# Patient Record
Sex: Male | Born: 1975 | Race: White | Hispanic: No | Marital: Married | Smoking: Never smoker
Health system: Southern US, Community
[De-identification: ages and names within clinical notes are randomized; demographics above are authoritative.]

## PROBLEM LIST (undated history)

## (undated) DIAGNOSIS — D239 Other benign neoplasm of skin, unspecified: Secondary | ICD-10-CM

## (undated) DIAGNOSIS — I82409 Acute embolism and thrombosis of unspecified deep veins of unspecified lower extremity: Secondary | ICD-10-CM

## (undated) DIAGNOSIS — J45909 Unspecified asthma, uncomplicated: Secondary | ICD-10-CM

## (undated) HISTORY — DX: Unspecified asthma, uncomplicated: J45.909

## (undated) HISTORY — PX: OTHER SURGICAL HISTORY: SHX169

## (undated) HISTORY — DX: Acute embolism and thrombosis of unspecified deep veins of unspecified lower extremity: I82.409

## (undated) HISTORY — DX: Other benign neoplasm of skin, unspecified: D23.9

---

## 2014-10-06 ENCOUNTER — Other Ambulatory Visit: Payer: Self-pay | Admitting: Gastroenterology

## 2014-10-06 DIAGNOSIS — R1011 Right upper quadrant pain: Secondary | ICD-10-CM

## 2014-10-06 DIAGNOSIS — R1013 Epigastric pain: Secondary | ICD-10-CM

## 2014-10-12 ENCOUNTER — Ambulatory Visit
Admission: RE | Admit: 2014-10-12 | Discharge: 2014-10-12 | Disposition: A | Discharge status: Home / Self Care | Payer: BLUE CROSS/BLUE SHIELD | Source: Ambulatory Visit | Attending: Gastroenterology | Admitting: Gastroenterology

## 2014-10-12 DIAGNOSIS — R1011 Right upper quadrant pain: Secondary | ICD-10-CM

## 2014-10-12 DIAGNOSIS — R1013 Epigastric pain: Secondary | ICD-10-CM

## 2014-11-08 ENCOUNTER — Ambulatory Visit
Admission: RE | Admit: 2014-11-08 | Discharge: 2014-11-08 | Disposition: A | Discharge status: Home / Self Care | Payer: BLUE CROSS/BLUE SHIELD | Source: Ambulatory Visit | Attending: Family Medicine | Admitting: Family Medicine

## 2014-11-08 ENCOUNTER — Other Ambulatory Visit: Payer: Self-pay | Admitting: Family Medicine

## 2014-11-08 DIAGNOSIS — J3489 Other specified disorders of nose and nasal sinuses: Secondary | ICD-10-CM

## 2016-12-16 ENCOUNTER — Encounter: Payer: Self-pay | Admitting: Neurology

## 2017-02-14 ENCOUNTER — Ambulatory Visit (INDEPENDENT_AMBULATORY_CARE_PROVIDER_SITE_OTHER): Payer: 59 | Admitting: Neurology

## 2017-02-14 ENCOUNTER — Other Ambulatory Visit (INDEPENDENT_AMBULATORY_CARE_PROVIDER_SITE_OTHER): Payer: 59

## 2017-02-14 ENCOUNTER — Encounter: Payer: Self-pay | Admitting: Neurology

## 2017-02-14 VITALS — BP 110/70 | HR 64 | Ht 75.0 in | Wt 187.1 lb

## 2017-02-14 DIAGNOSIS — R253 Fasciculation: Secondary | ICD-10-CM

## 2017-02-14 LAB — TSH: TSH: 1.43 u[IU]/mL (ref 0.35–4.50)

## 2017-02-14 LAB — VITAMIN B12: VITAMIN B 12: 412 pg/mL (ref 211–911)

## 2017-02-14 NOTE — Progress Notes (Signed)
[facility] Clinic Note - Initial Visit   Date: 02/14/17  Ural Acree MRN: [id number] DOB: 29-Jan-1976   Dear Dr. Kateri Plummer:  Thank you for your kind referral of Ilir Shea for consultation of muscle twitching. Although his history is well known to you, please allow [place] to reiterate it for the purpose of our medical record. The patient was accompanied to the clinic by self.   History of Present Illness: Jack Shea is a 41 y.o. right-handed Caucasian male with asthma presenting for evaluation of muscle twitches.    He is very active and run 20-miles/day.  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  Starting around July 2018, he started feeling twitching of the calves, which gradually involved his thighs, hips, elbows and hands.  He has only visualized it over the left ADM muscle.  Muscle twitches are not painful.  It is worse when he is at rest, such as on a flight or in the morning. Stress seems to trigger this.  He does not notice it when active.  He has eliminated caffeine, magnesium, and salt supplements.  He does not take any daily medications or supplements. He denies any weakness, cramps, difficulty swallowing/talking, or shortness of breath.  Over the the past 6 weeks, the sensation of muscles fluttering is much less frequent and intense.  His PCP's note from August was reviewed.  Labs looking for electrolyte imbalance were normal.  In 2016, he had severe GI illness due to food poisoning while in [place].  About 6 months later, he had a tick bite and was treated with a course of antibiotics for possible sinusitis and fatigue. He did not have any specific tick borne illness.  Past Medical History:  Diagnosis Date  . Asthma     Past Surgical History:  Procedure Laterality Date  . Facial reconstruction due to orbital and maxilla fracture        Medications:  No outpatient encounter  prescriptions on file as of 02/14/2017.   No facility-administered encounter medications on file as of 02/14/2017.      Allergies: No Known Allergies  Family History: Family History  Problem Relation Age of Onset  . Parkinson's disease Mother   . Stroke Father     Social History: Social History  Substance Use Topics  . Smoking status: Never Smoker  . Smokeless tobacco: Never Used  . Alcohol use Yes     Comment: 6-7 drinks per week   Social History   Social History Naval architect of Operations for [employer]   Education:  Therapist, occupational   He lives with wife and two children in a 2 story home.      Review of Systems:  CONSTITUTIONAL: No fevers, chills, night sweats, or weight loss.   EYES: No visual changes or eye pain ENT: No hearing changes.  No history of nose bleeds.   RESPIRATORY: No cough, wheezing and shortness of breath.   CARDIOVASCULAR: Negative for chest pain, and palpitations.   GI: Negative for abdominal discomfort, blood in stools or black stools.  No recent change in bowel habits.   GU:  No history of incontinence.   MUSCLOSKELETAL: No history of joint pain or swelling.  No myalgias.   SKIN: Negative for lesions, rash, and itching.   HEMATOLOGY/ONCOLOGY: Negative for prolonged bleeding, bruising easily, and swollen nodes.  No history of cancer.   ENDOCRINE: Negative for cold or heat  intolerance, polydipsia or goiter.   PSYCH:  No depression or anxiety symptoms.   NEURO: As Above.   Vital Signs:  BP 110/70   Pulse 64   Ht 6\' 3"  (1.905 m)   Wt 187 lb 2 oz (84.9 kg)   SpO2 97%   BMI 23.39 kg/m    General Medical Exam:   General:  Well appearing, comfortable.   Eyes/ENT: see cranial nerve examination.   Neck: No masses appreciated.  Full range of motion without tenderness.  No carotid bruits. Respiratory:  Clear to auscultation, good air entry bilaterally.   Cardiac:  Regular rate and rhythm, no murmur.   Extremities:   No deformities, edema, or skin discoloration.  Skin:  No rashes or lesions.  Neurological Exam: MENTAL STATUS including orientation to time, place, person, recent and remote memory, attention span and concentration, language, and fund of knowledge is normal.  Speech is not dysarthric.  CRANIAL NERVES II:  No visual field defects.  Unremarkable fundi.   III-IV-VI: Pupils equal round and reactive to light.  Normal conjugate, extra-ocular eye movements in all directions of gaze.  No nystagmus.  No ptosis.   V:  Normal facial sensation.    VII:  Normal facial symmetry and movements.  No pathologic facial reflexes.  VIII:  Normal hearing and vestibular function.   IX-X:  Normal palatal movement.   XI:  Normal shoulder shrug and head rotation.   XII:  Normal tongue strength and range of motion, no deviation or fasciculation.  MOTOR:  No atrophy, fasciculations or abnormal movements.  No pronator drift.  Tone is normal.    Right Upper Extremity:    Left Upper Extremity:    Deltoid  5/5   Deltoid  5/5   Biceps  5/5   Biceps  5/5   Triceps  5/5   Triceps  5/5   Wrist extensors  5/5   Wrist extensors  5/5   Wrist flexors  5/5   Wrist flexors  5/5   Finger extensors  5/5   Finger extensors  5/5   Finger flexors  5/5   Finger flexors  5/5   Dorsal interossei  5/5   Dorsal interossei  5/5   Abductor pollicis  5/5   Abductor pollicis  5/5   Tone (Ashworth scale)  0  Tone (Ashworth scale)  0   Right Lower Extremity:    Left Lower Extremity:    Hip flexors  5/5   Hip flexors  5/5   Hip extensors  5/5   Hip extensors  5/5   Knee flexors  5/5   Knee flexors  5/5   Knee extensors  5/5   Knee extensors  5/5   Dorsiflexors  5/5   Dorsiflexors  5/5   Plantarflexors  5/5   Plantarflexors  5/5   Toe extensors  5/5   Toe extensors  5/5   Toe flexors  5/5   Toe flexors  5/5   Tone (Ashworth scale)  0  Tone (Ashworth scale)  0   MSRs:  Right                                                                  Left brachioradialis 2+  brachioradialis 2+  biceps  2+  biceps 2+  triceps 2+  triceps 2+  patellar 2+  patellar 2+  ankle jerk 2+  ankle jerk 2+  Hoffman no  Hoffman no  plantar response down  plantar response down   SENSORY:  Normal and symmetric perception of light touch, pinprick, vibration, and proprioception.  Romberg's sign absent.   COORDINATION/GAIT: Normal finger-to- nose-finger and heel-to-shin.  Intact rapid alternating movements bilaterally.  Gait narrow based and stable. Tandem and stressed gait intact.    IMPRESSION: Mr. Maury DusShirlow is a 41 year-old man referred for evaluation of subjective sensation of muscle twitches.  His exam is entirely normal and I do not see any fasciculations or abnormal movements.  Labs looking for electrolyte imbalance was normal.  To be complete, I will check TSH and vitamin B12.  I reassured the patient the I did not see anything worrisome and these may be stress-induced or due to lack of regular sleep schedule from his frequent international travel for work. Encouraged patient to stay well-hydrated. All questions were answered.  Thank you for allowing me to participate in patient's care.  If I can answer any additional questions, I would be pleased to do so.    Sincerely,    Eirik Schueler K. Allena KatzPatel, DO

## 2017-02-14 NOTE — Patient Instructions (Signed)
Stay well-hydrated with electrolyte water, especially when active  Stress seems to be a trigger of your symptoms, recommend deep breathing, meditation, yoga exercises which may help  Check thyroid and vitamin B12 levels today  Please sign up for MyChart and we can post your results directly on the portal

## 2017-02-17 ENCOUNTER — Telehealth: Payer: Self-pay | Admitting: *Deleted

## 2017-02-17 NOTE — Telephone Encounter (Signed)
Patient notified

## 2017-02-17 NOTE — Telephone Encounter (Signed)
-----   Message from [place] Chardonika K Patel, DO sent at 02/14/2017  4:55 PM EDT ----- Please notify patient lab are within normal limits.  Thank you.

## 2018-03-20 DIAGNOSIS — H6981 Other specified disorders of Eustachian tube, right ear: Secondary | ICD-10-CM | POA: Diagnosis not present

## 2018-05-12 DIAGNOSIS — H6983 Other specified disorders of Eustachian tube, bilateral: Secondary | ICD-10-CM | POA: Diagnosis not present

## 2018-05-12 DIAGNOSIS — J343 Hypertrophy of nasal turbinates: Secondary | ICD-10-CM | POA: Diagnosis not present

## 2018-05-12 DIAGNOSIS — R1312 Dysphagia, oropharyngeal phase: Secondary | ICD-10-CM | POA: Diagnosis not present

## 2018-05-12 DIAGNOSIS — H9311 Tinnitus, right ear: Secondary | ICD-10-CM | POA: Diagnosis not present

## 2018-05-12 DIAGNOSIS — J342 Deviated nasal septum: Secondary | ICD-10-CM | POA: Diagnosis not present

## 2018-05-19 ENCOUNTER — Other Ambulatory Visit: Payer: Self-pay | Admitting: Otolaryngology

## 2018-05-19 DIAGNOSIS — H93A9 Pulsatile tinnitus, unspecified ear: Secondary | ICD-10-CM

## 2018-05-22 ENCOUNTER — Ambulatory Visit
Admission: RE | Admit: 2018-05-22 | Discharge: 2018-05-22 | Disposition: A | Discharge status: Home / Self Care | Payer: 59 | Source: Ambulatory Visit | Attending: Otolaryngology | Admitting: Otolaryngology

## 2018-05-22 DIAGNOSIS — H93A1 Pulsatile tinnitus, right ear: Secondary | ICD-10-CM | POA: Diagnosis not present

## 2018-05-22 DIAGNOSIS — H93A9 Pulsatile tinnitus, unspecified ear: Secondary | ICD-10-CM

## 2018-05-22 MED ORDER — GADOBENATE DIMEGLUMINE 529 MG/ML IV SOLN
17.0000 mL | Freq: Once | INTRAVENOUS | Status: AC | PRN
  Administered 2018-05-22: 17 mL via INTRAVENOUS

## 2018-10-28 DIAGNOSIS — Z20828 Contact with and (suspected) exposure to other viral communicable diseases: Secondary | ICD-10-CM | POA: Diagnosis not present

## 2019-02-28 DIAGNOSIS — Z20828 Contact with and (suspected) exposure to other viral communicable diseases: Secondary | ICD-10-CM | POA: Diagnosis not present

## 2020-01-29 DIAGNOSIS — L209 Atopic dermatitis, unspecified: Secondary | ICD-10-CM | POA: Diagnosis not present

## 2020-02-24 DIAGNOSIS — H00015 Hordeolum externum left lower eyelid: Secondary | ICD-10-CM | POA: Diagnosis not present

## 2020-02-28 DIAGNOSIS — Z20822 Contact with and (suspected) exposure to covid-19: Secondary | ICD-10-CM | POA: Diagnosis not present

## 2020-08-04 DIAGNOSIS — A09 Infectious gastroenteritis and colitis, unspecified: Secondary | ICD-10-CM | POA: Diagnosis not present

## 2020-12-28 NOTE — Progress Notes (Addendum)
Subjective:    CC: R knee pain  I, Jack Shea, LAT, ATC, am serving as scribe for Dr. Clementeen Graham.  HPI: Pt is a 45 y/o male presenting w/ R knee pain x 8 weeks.  Pt is a runner. Pt noticed R knee stiffness after shingling on a roof. He locates his pain to the medial aspect of his R knee and posteriorly when extending knee.  He has been unable to run since hurting his knee.  He has been doing exercises over the last 4 weeks to improve his knee pain including quadricep strengthening.  This has not helped.  Exercises are approximately daily for 20 minutes  Knee swelling: no Knee mechanical symptoms: yes clicking a popping Aggravating factors: stairs, sitting still for extended periods Treatments tried: ice, brace, NSAIDs  Pertinent review of Systems: no fever or chills  Relevant historical information: Otherwise healthy.  Running is his primary form of exercise.   Objective:    Vitals:   12/29/20 0801  BP: 130/86  Pulse: 75  SpO2: 98%   General: Well Developed, well nourished, and in no acute distress.   MSK: Right knee normal-appearing Normal motion without significant crepitation. Tender palpation medial joint line. Stable ligamentous exam. Positive medial McMurray's test. Intact strength.  Lab and Radiology Results  Procedure: Real-time Ultrasound Guided Injection of right knee superior lateral patellar space Device: Philips Affiniti 50G Images permanently stored and available for review in PACS Ultrasound evaluation prior to injection reveals intact quad tendon with mild joint effusion. Patellar tendon normal. Lateral joint line normal. Medial joint line slightly extruded without clear definitive meniscus tear. Posterior knee tiny Baker's cyst. Verbal informed consent obtained.  Discussed risks and benefits of procedure. Warned about infection bleeding damage to structures skin hypopigmentation and fat atrophy among others. Patient expresses understanding  and agreement Time-out conducted.   Noted no overlying erythema, induration, or other signs of local infection.   Skin prepped in a sterile fashion.   Local anesthesia: Topical Ethyl chloride.   With sterile technique and under real time ultrasound guidance: 40 mg of Kenalog and 2 mL of Marcaine injected into knee joint. Fluid seen entering the joint capsule.   Completed without difficulty   Pain immediately resolved suggesting accurate placement of the medication.   Advised to call if fevers/chills, erythema, induration, drainage, or persistent bleeding.   Images permanently stored and available for review in the ultrasound unit.  Impression: Technically successful ultrasound guided injection.    X-ray images right knee obtained today personally and independently interpreted. Minimal medial compartment DJD Await formal radiology review    Impression and Recommendations:    Assessment and Plan: 45 y.o. male with right knee pain that medial knee compartment with positive medial McMurray's test concerning for medial meniscus tear.  Patient has been unable to return to normal running and regular exercise over the last 4 weeks despite doing conservative management strategies including home exercise program..  Plan to continue and modify home exercise program as taught today in clinic by ATC.  Additionally recommend Voltaren gel and we will proceed with steroid injection.  If not improving over the next few weeks he will let me know and I will proceed to MRI.   AddendumGerlene Shea contacted me on September 15 and again on September 16 noting that his knee has worsened and now he has developed more significant mechanical symptoms including locking and catching.  He has failed conservative management and would like to proceed to MRI.  MRI ordered.  PDMP not reviewed this encounter. Orders Placed This Encounter  Procedures   [place] LIMITED JOINT SPACE STRUCTURES LOW RIGHT(NO LINKED CHARGES)     Standing Status:   Future    Number of Occurrences:   1    Standing Expiration Date:   06/28/2021    Order Specific Question:   Reason for Exam (SYMPTOM  OR DIAGNOSIS REQUIRED)    Answer:   right knee pain    Order Specific Question:   Preferred imaging location?    Answer:   [facility] Knee AP/LAT W/Sunrise Right    Standing Status:   Future    Number of Occurrences:   1    Standing Expiration Date:   12/29/2021    Order Specific Question:   Reason for Exam (SYMPTOM  OR DIAGNOSIS REQUIRED)    Answer:   right knee pain    Order Specific Question:   Preferred imaging location?    Answer:   [facility]   No orders of the defined types were placed in this encounter.   Discussed warning signs or symptoms. Please see discharge instructions. Patient expresses understanding.   The above documentation has been reviewed and is accurate and complete Clementeen Graham, M.D.

## 2020-12-29 ENCOUNTER — Ambulatory Visit: Payer: Self-pay

## 2020-12-29 ENCOUNTER — Other Ambulatory Visit: Payer: Self-pay

## 2020-12-29 ENCOUNTER — Ambulatory Visit (INDEPENDENT_AMBULATORY_CARE_PROVIDER_SITE_OTHER): Payer: BC Managed Care – PPO

## 2020-12-29 ENCOUNTER — Ambulatory Visit (INDEPENDENT_AMBULATORY_CARE_PROVIDER_SITE_OTHER): Payer: BC Managed Care – PPO | Admitting: Family Medicine

## 2020-12-29 VITALS — BP 130/86 | HR 75 | Ht 75.0 in | Wt 199.8 lb

## 2020-12-29 DIAGNOSIS — M25561 Pain in right knee: Secondary | ICD-10-CM

## 2020-12-29 DIAGNOSIS — M1711 Unilateral primary osteoarthritis, right knee: Secondary | ICD-10-CM | POA: Diagnosis not present

## 2020-12-29 NOTE — Patient Instructions (Addendum)
Thank you for coming in today.   Call or go to the ER if you develop a large red swollen joint with extreme pain or oozing puss.    Please complete the exercises that the athletic trainer went over with you:  View at [email or web address] using code: Andrey Campanile  Let me know if you are not improving. I will order an MRI.

## 2021-01-02 NOTE — Progress Notes (Signed)
Right knee x-ray shows mild arthritis.

## 2021-01-11 ENCOUNTER — Encounter: Payer: Self-pay | Admitting: Family Medicine

## 2021-01-11 DIAGNOSIS — M25561 Pain in right knee: Secondary | ICD-10-CM

## 2021-01-21 ENCOUNTER — Other Ambulatory Visit: Payer: Self-pay

## 2021-01-21 ENCOUNTER — Ambulatory Visit (INDEPENDENT_AMBULATORY_CARE_PROVIDER_SITE_OTHER): Payer: BC Managed Care – PPO

## 2021-01-21 DIAGNOSIS — G8929 Other chronic pain: Secondary | ICD-10-CM | POA: Diagnosis not present

## 2021-01-21 DIAGNOSIS — M25561 Pain in right knee: Secondary | ICD-10-CM

## 2021-01-23 ENCOUNTER — Telehealth: Payer: Self-pay | Admitting: Family Medicine

## 2021-01-23 DIAGNOSIS — M25561 Pain in right knee: Secondary | ICD-10-CM

## 2021-01-23 NOTE — Telephone Encounter (Signed)
Referral to orthopedic surgery ordered 

## 2021-01-23 NOTE — Progress Notes (Signed)
Right knee shows a medial meniscus tear flipped into the knee joint.  Additionally there is a area of high-grade cartilage loss into the knee joint that is also causing a problem.  This is probably a surgical problem.  I will refer directly to orthopedic surgery as I think that is ultimately where you are heading.  However if you would like to have a longer conversation about the results I am happy to have you return to clinic to go over in more detail.

## 2021-01-25 ENCOUNTER — Encounter: Payer: Self-pay | Admitting: Orthopaedic Surgery

## 2021-01-25 ENCOUNTER — Ambulatory Visit (INDEPENDENT_AMBULATORY_CARE_PROVIDER_SITE_OTHER): Payer: BC Managed Care – PPO | Admitting: Orthopaedic Surgery

## 2021-01-25 ENCOUNTER — Other Ambulatory Visit: Payer: Self-pay

## 2021-01-25 DIAGNOSIS — S83241A Other tear of medial meniscus, current injury, right knee, initial encounter: Secondary | ICD-10-CM

## 2021-01-25 HISTORY — DX: Other tear of medial meniscus, current injury, right knee, initial encounter: S83.241A

## 2021-01-25 NOTE — Progress Notes (Signed)
Office Visit Note   Patient: Jack Shea           Date of Birth: 1975/06/20           MRN: [id number] Visit Date: 01/25/2021              Requested by: Rodolph Bong, MD [address] [place],  [place] [address] PCP: Patient, No Pcp Per (Inactive)   Assessment & Plan: Visit Diagnoses:  1. Acute medial meniscus tear of right knee, initial encounter     Plan: MRI of the right knee shows a complex tear of the medial meniscus that has flipped into the medial gutter with some small surrounding subchondral edema that appears reactive.  He has chondromalacia of the medial compartment as well.  After reviewing these findings with Jack Shea I have recommended arthroscopic partial medial meniscectomy and chondroplasty.  He has had a prior cortisone injection without any significant relief and he continues to have constant locking symptoms with daily activities.  Risk benefits rehab recovery time out of work reviewed with the patient in detail.  Questions encouraged and answered.  We look forward to treating him in the operating theater in the near future.  Follow-Up Instructions: No follow-ups on file.   Orders:  No orders of the defined types were placed in this encounter.  No orders of the defined types were placed in this encounter.     Procedures: No procedures performed   Clinical Data: No additional findings.   Subjective: Chief Complaint  Patient presents with   Right Knee - Pain    Jack Shea is a very pleasant 45 year old gentleman referral from Dr. Denyse Amass for recent discovery of a medial meniscus tear found on MRI.  He has had pain from 12 weeks and it began after he knelt down to repair some roof shingles and since then he has had pain.  He is also an avid runner who runs about 15 to 20 miles a week.  He is experiencing constant locking symptoms on the medial side.  Occasional swelling.  Start up pain is fairly consistent.  He had a prior cortisone injection in early  September which gave him about 2 days of relief but then once he tried to run the pain immediately came back.  Subsequent MRI showed a complex tear of the medial meniscus.   Review of Systems  Constitutional: Negative.   All other systems reviewed and are negative.   Objective: Vital Signs: There were no vitals taken for this visit.  Physical Exam Vitals and nursing note reviewed.  Constitutional:      Appearance: He is well-developed.  Pulmonary:     Effort: Pulmonary effort is normal.  Abdominal:     Palpations: Abdomen is soft.  Skin:    General: Skin is warm.  Neurological:     Mental Status: He is alert and oriented to person, place, and time.  Psychiatric:        Behavior: Behavior normal.        Thought Content: Thought content normal.        Judgment: Judgment normal.    Ortho Exam  Right knee shows exquisite medial joint line tenderness with positive McMurray's sign.  Increased pain with flexion of the knee past 120 degrees.  Collaterals and cruciates are stable.  No joint effusion.  Specialty Comments:  No specialty comments available.  Imaging: No results found.   PMFS History: There are no problems to display for this patient.  Past  Medical History:  Diagnosis Date   Asthma     Family History  Problem Relation Age of Onset   Parkinson's disease Mother    Stroke Father     Past Surgical History:  Procedure Laterality Date   Facial reconstruction due to orbital and maxilla fracture      Social History   Occupational History   Occupation: Interior and spatial designer of operations  Tobacco Use   Smoking status: Never   Smokeless tobacco: Never  Vaping Use   Vaping Use: Never used  Substance and Sexual Activity   Alcohol use: Yes    Comment: 6-7 drinks per week   Drug use: No   Sexual activity: Not on file

## 2021-02-05 ENCOUNTER — Other Ambulatory Visit: Payer: Self-pay | Admitting: Physician Assistant

## 2021-02-05 MED ORDER — ONDANSETRON HCL 4 MG PO TABS: 4.0000 mg | ORAL_TABLET | Freq: Three times a day (TID) | ORAL | 0 refills | Status: DC | PRN

## 2021-02-05 MED ORDER — HYDROCODONE-ACETAMINOPHEN 5-325 MG PO TABS: 1.0000 | ORAL_TABLET | Freq: Four times a day (QID) | ORAL | 0 refills | Status: DC | PRN

## 2021-02-08 ENCOUNTER — Encounter: Payer: Self-pay | Admitting: Orthopaedic Surgery

## 2021-02-08 DIAGNOSIS — S83231A Complex tear of medial meniscus, current injury, right knee, initial encounter: Secondary | ICD-10-CM | POA: Diagnosis not present

## 2021-02-08 DIAGNOSIS — S83241A Other tear of medial meniscus, current injury, right knee, initial encounter: Secondary | ICD-10-CM | POA: Diagnosis not present

## 2021-02-08 DIAGNOSIS — G8918 Other acute postprocedural pain: Secondary | ICD-10-CM | POA: Diagnosis not present

## 2021-02-08 DIAGNOSIS — X58XXXA Exposure to other specified factors, initial encounter: Secondary | ICD-10-CM | POA: Diagnosis not present

## 2021-02-08 DIAGNOSIS — Y999 Unspecified external cause status: Secondary | ICD-10-CM | POA: Diagnosis not present

## 2021-02-08 DIAGNOSIS — M659 Synovitis and tenosynovitis, unspecified: Secondary | ICD-10-CM | POA: Diagnosis not present

## 2021-02-15 ENCOUNTER — Ambulatory Visit (HOSPITAL_COMMUNITY)
Admission: RE | Admit: 2021-02-15 | Discharge: 2021-02-15 | Disposition: A | Discharge status: Home / Self Care | Payer: BC Managed Care – PPO | Source: Ambulatory Visit | Attending: Orthopaedic Surgery | Admitting: Orthopaedic Surgery

## 2021-02-15 ENCOUNTER — Other Ambulatory Visit: Payer: Self-pay

## 2021-02-15 ENCOUNTER — Ambulatory Visit (INDEPENDENT_AMBULATORY_CARE_PROVIDER_SITE_OTHER): Payer: BC Managed Care – PPO | Admitting: Physician Assistant

## 2021-02-15 ENCOUNTER — Encounter: Payer: Self-pay | Admitting: Orthopaedic Surgery

## 2021-02-15 ENCOUNTER — Telehealth: Payer: Self-pay

## 2021-02-15 ENCOUNTER — Telehealth: Payer: Self-pay | Admitting: Physician Assistant

## 2021-02-15 ENCOUNTER — Other Ambulatory Visit: Payer: Self-pay | Admitting: Physician Assistant

## 2021-02-15 VITALS — Ht 75.0 in | Wt 199.0 lb

## 2021-02-15 DIAGNOSIS — M79661 Pain in right lower leg: Secondary | ICD-10-CM

## 2021-02-15 DIAGNOSIS — Z9889 Other specified postprocedural states: Secondary | ICD-10-CM

## 2021-02-15 MED ORDER — RIVAROXABAN (XARELTO) VTE STARTER PACK (15 & 20 MG): ORAL_TABLET | ORAL | 0 refills | Status: DC

## 2021-02-15 NOTE — Telephone Encounter (Signed)
done

## 2021-02-15 NOTE — Progress Notes (Signed)
   Post-Op Visit Note   Patient: Jack Shea           Date of Birth: January 09, 1976           MRN: [id number] Visit Date: 02/15/2021 PCP: Patient, No Pcp Per (Inactive)   Assessment & Plan:  Chief Complaint:  Chief Complaint  Patient presents with   Right Knee - Follow-up    Right knee scope 02/08/2021   Visit Diagnoses:  1. Pain in right lower leg   2. S/P right knee arthroscopy     Plan: Patient is a pleasant 45 year old gentleman who comes in today 1 week out right knee arthroscopic debridement medial meniscus, date of surgery 02/08/2021.  He has been doing well.  He is ambulating with a crutch as needed.  He is not having any pain to the knee, but does note pain and tightness to the right calf.  He has also noticed some swelling to the right knee.  No personal or family history of DVT/PE.  He is a non-smoker.  He is not on any blood thinners.  Examination of the right lower extremity reveals a small effusion to the knee.  Well-healed surgical portals with nylon sutures in place.  No evidence of infection or cellulitis.  No swelling to the lower extremity.  His calf is moderately tender.  No skin changes, warmth or erythema.  He does have a positive Homans.  He is neurovascular intact distally.  Today, sutures were removed and Steri-Strips applied.  Intraoperative pictures reviewed.  Home exercise program provided.  We will go ahead and order a venous Doppler ultrasound right lower extremity to rule out DVT.  He will follow-up with [place] in 5 weeks time for recheck.  Call with concerns or questions in the meantime.  Follow-Up Instructions: Return in about 5 weeks (around 03/22/2021).   Orders:  Orders Placed This Encounter  Procedures   VAS [place] LOWER EXTREMITY VENOUS (DVT)    No orders of the defined types were placed in this encounter.   Imaging: No new imaging  PMFS History: Patient Active Problem List   Diagnosis Date Noted   Acute medial meniscus tear of right knee  01/25/2021   Past Medical History:  Diagnosis Date   Asthma     Family History  Problem Relation Age of Onset   Parkinson's disease Mother    Stroke Father     Past Surgical History:  Procedure Laterality Date   Facial reconstruction due to orbital and maxilla fracture      Social History   Occupational History   Occupation: Interior and spatial designer of operations  Tobacco Use   Smoking status: Never   Smokeless tobacco: Never  Vaping Use   Vaping Use: Never used  Substance and Sexual Activity   Alcohol use: Yes    Comment: 6-7 drinks per week   Drug use: No   Sexual activity: Not on file

## 2021-02-15 NOTE — Telephone Encounter (Signed)
Patient called. Says the pharmacy will not have the medication until Monday. He would like the Xarelto sent to [facility] [address] His cb# [phone]

## 2021-02-15 NOTE — Progress Notes (Signed)
Lower extremity venous RT study completed.  Preliminary results relayed to office of Roda Shutters, MD. Lillia Abed, [place] to follow up.   See CV Proc for preliminary results report.   Jean Rosenthal, RDMS, RVT

## 2021-02-15 NOTE — Telephone Encounter (Signed)
He said nvm use [facility] [address]   Call when done please

## 2021-02-15 NOTE — Telephone Encounter (Signed)
+   DVT .  Jack Shea will in Rx. Patient aware he should contact PCP.

## 2021-02-16 ENCOUNTER — Other Ambulatory Visit: Payer: Self-pay

## 2021-02-16 DIAGNOSIS — Z7689 Persons encountering health services in other specified circumstances: Secondary | ICD-10-CM

## 2021-02-16 DIAGNOSIS — M79661 Pain in right lower leg: Secondary | ICD-10-CM

## 2021-02-16 NOTE — Telephone Encounter (Signed)
Thank you :)

## 2021-02-22 ENCOUNTER — Encounter: Payer: BC Managed Care – PPO | Admitting: Orthopaedic Surgery

## 2021-02-26 ENCOUNTER — Encounter (HOSPITAL_BASED_OUTPATIENT_CLINIC_OR_DEPARTMENT_OTHER): Payer: Self-pay | Admitting: Family Medicine

## 2021-02-26 ENCOUNTER — Ambulatory Visit (INDEPENDENT_AMBULATORY_CARE_PROVIDER_SITE_OTHER): Payer: BC Managed Care – PPO | Admitting: Family Medicine

## 2021-02-26 ENCOUNTER — Other Ambulatory Visit: Payer: Self-pay

## 2021-02-26 DIAGNOSIS — I82401 Acute embolism and thrombosis of unspecified deep veins of right lower extremity: Secondary | ICD-10-CM

## 2021-02-26 DIAGNOSIS — I82409 Acute embolism and thrombosis of unspecified deep veins of unspecified lower extremity: Secondary | ICD-10-CM | POA: Insufficient documentation

## 2021-02-26 MED ORDER — RIVAROXABAN 20 MG PO TABS: 20.0000 mg | ORAL_TABLET | Freq: Every day | ORAL | 1 refills | Status: DC

## 2021-02-26 NOTE — Assessment & Plan Note (Signed)
Acute DVT which would be considered provoked given that this occurred in the postoperative setting after right knee meniscal surgery.  As such discussed recommendations for 3 months of anticoagulation. Recommend continuing with current starter pack as prescribed by orthopedic surgeon, will send in for refills for daily Xarelto to continue until the end of the 69-month period. Would be okay to resume traveling for shorter flights to the [facility] as indicated above. Plan follow-up in about 2 months to monitor progress with Xarelto

## 2021-02-26 NOTE — Progress Notes (Signed)
New Patient Office Visit  Subjective:  Patient ID: Jack Shea, male    DOB: 1975/05/05  Age: 45 y.o. MRN: [id number]  CC:  Chief Complaint  Patient presents with   Establish Care    No Prior PCP    Blood Clot    Patient had a surgery to have his meniscus repaired on his right knee on 10/14 and later developed a blood clot in his right calf. He was started on xarelto from surgeons office and told to follow up with PCP     HPI Perle Gibbon is a 45 year old male presenting to establish in clinic.  Current concern related to developing blood clot after meniscal surgery.  Denies any significant past medical history.  Blood clot: Postoperatively after right meniscal surgery, patient was found to have acute DVT involving right gastrocnemius veins.  Patient was started on Xarelto by orthopedic surgeon and advised to follow-up with his PCP.  He began Xarelto about 11 days ago.  Reports that he has been tolerating this well.  Still some soreness in right calf, has improved recently.  Patient is originally from [place], [place].  Current work does involve a lot of travel as he oversees a business unit.  Indicates that travel is not as much as it used to be, he used to travel more so to [place] as well as [place].  Current travel is primarily to the [place].  Any flights that he does take are oftentimes less than 1 hour.  Patient has lived in [place] for about 15 years now.  Outside of work, he enjoys running, soccer, Government social research officer.  Past Medical History:  Diagnosis Date   Asthma     Past Surgical History:  Procedure Laterality Date   Facial reconstruction due to orbital and maxilla fracture       Family History  Problem Relation Age of Onset   Parkinson's disease Mother    Stroke Father    Early death Father     Social History   Socioeconomic History   Marital status: Married    Spouse name: Not on file   Number of children: 2   Years of education: 16    Highest education level: Not on file  Occupational History   Occupation: Interior and spatial designer of operations  Tobacco Use   Smoking status: Never   Smokeless tobacco: Never  Vaping Use   Vaping Use: Never used  Substance and Sexual Activity   Alcohol use: Yes    Alcohol/week: 8.0 standard drinks    Types: 3 Glasses of wine, 5 Cans of beer per week    Comment: 6-7 drinks per week   Drug use: No   Sexual activity: Not on file  Other Topics Concern   Not on file  Social History Narrative   Director of Operations for [employer]   Education:  Therapist, occupational   He lives with wife and two children in a 2 story home.     Social Determinants of Health   Financial Resource Strain: Not on file  Food Insecurity: Not on file  Transportation Needs: Not on file  Physical Activity: Not on file  Stress: Not on file  Social Connections: Not on file  Intimate Partner Violence: Not on file    Objective:   Today's Vitals: BP 110/84   Ht 6\' 3"  (1.905 m)   Wt 194 lb 6.4 oz (88.2 kg)   BMI 24.30 kg/m   Physical Exam  45 year old male in no acute  distress Cardiovascular exam with regular rate and rhythm, no murmurs appreciated Lungs clear to auscultation bilaterally  Assessment & Plan:   Problem List Items Addressed This Visit       Cardiovascular and Mediastinum   DVT (deep venous thrombosis) (HCC)    Acute DVT which would be considered provoked given that this occurred in the postoperative setting after right knee meniscal surgery.  As such discussed recommendations for 3 months of anticoagulation. Recommend continuing with current starter pack as prescribed by orthopedic surgeon, will send in for refills for daily Xarelto to continue until the end of the 78-month period. Would be okay to resume traveling for shorter flights to the [facility] as indicated above. Plan follow-up in about 2 months to monitor progress with Xarelto      Relevant Medications   rivaroxaban  (XARELTO) 20 MG TABS tablet    Outpatient Encounter Medications as of 02/26/2021  Medication Sig   rivaroxaban (XARELTO) 20 MG TABS tablet Take 1 tablet (20 mg total) by mouth daily with supper.   [DISCONTINUED] RIVAROXABAN (XARELTO) VTE STARTER PACK (15 & 20 MG) Follow package directions: Take one 15mg  tablet by mouth twice a day. On day 22, switch to one 20mg  tablet once a day. Take with food.   [DISCONTINUED] HYDROcodone-acetaminophen (NORCO) 5-325 MG tablet Take 1 tablet by mouth every 6 (six) hours as needed. To be taken after surgery   [DISCONTINUED] ondansetron (ZOFRAN) 4 MG tablet Take 1 tablet (4 mg total) by mouth every 8 (eight) hours as needed for nausea or vomiting. (Patient not taking: Reported on 02/26/2021)   No facility-administered encounter medications on file as of 02/26/2021.    Follow-up: Return in about 2 months (around 04/28/2021) for Follow Up.  Plan for follow-up in about 2 months to monitor anticoagulation therapy.  Irina Okelly J De 02/28/2021, MD

## 2021-02-26 NOTE — Patient Instructions (Signed)
°  Medication Instructions:  °Your physician recommends that you continue on your current medications as directed. Please refer to the Current Medication list given to you today. °--If you need a refill on any your medications before your next appointment, please call your pharmacy first. If no refills are authorized on file call the office.-- °Follow-Up: °Your next appointment:   °Your physician recommends that you schedule a follow-up appointment in: 2 MONTHS with Dr. de [place] ° °You will receive a text message or e-mail with a link to a survey about your care and experience with us today! We would greatly appreciate your feedback!  ° °Thanks for letting us be apart of your health journey!!  °Primary Care and Sports Medicine  ° °Dr. Raymond de [place]  ° °We encourage you to activate your patient portal called "MyChart".  Sign up information is provided on this After Visit Summary.  MyChart is used to connect with patients for Virtual Visits (Telemedicine).  Patients are able to view lab/test results, encounter notes, upcoming appointments, etc.  Non-urgent messages can be sent to your provider as well. To learn more about what you can do with MyChart, please visit --  [email or web address]   ° °

## 2021-03-16 ENCOUNTER — Other Ambulatory Visit: Payer: Self-pay

## 2021-03-16 ENCOUNTER — Ambulatory Visit (INDEPENDENT_AMBULATORY_CARE_PROVIDER_SITE_OTHER): Payer: BC Managed Care – PPO | Admitting: Orthopaedic Surgery

## 2021-03-16 ENCOUNTER — Encounter: Payer: Self-pay | Admitting: Orthopaedic Surgery

## 2021-03-16 DIAGNOSIS — S83241A Other tear of medial meniscus, current injury, right knee, initial encounter: Secondary | ICD-10-CM

## 2021-03-16 NOTE — Progress Notes (Signed)
   Post-Op Visit Note   Patient: Jack Shea           Date of Birth: January 13, 1976           MRN: [id number] Visit Date: 03/16/2021 PCP: de [place], Raymond J, MD   Assessment & Plan:  Chief Complaint:  Chief Complaint  Patient presents with   Right Knee - Follow-up    Right knee arthroscopy 02/08/2021   Visit Diagnoses:  1. Acute medial meniscus tear of right knee, initial encounter     Plan: Shammond is about 5 weeks status post right knee scope partial medial meniscectomy.  He is doing well overall.  He will be taking Xarelto for 3 months for the DVT.  In terms of his activity he has been doing squats at the gym and 2 miles on the elliptical and running in the pool.  He feels some clicking behind the patella and some difficulty with coming downstairs due to lack of range of motion.  Right knee shows fully healed surgical scars.  He does have a moderate joint effusion with moderate limitation in flexion.  No joint line tenderness.  Based on findings I think his knee is inflamed from overactivity.  I have recommended that he back off on this so that the knee can recover from the surgery.  I feel that the effusion is the source of his symptoms.  Will hold off on aspiration and cortisone injection for now unless it does not resolve.  He will come back to see me as needed.  Follow-Up Instructions: No follow-ups on file.   Orders:  No orders of the defined types were placed in this encounter.  No orders of the defined types were placed in this encounter.   Imaging: No results found.  PMFS History: Patient Active Problem List   Diagnosis Date Noted   DVT (deep venous thrombosis) (HCC) 02/26/2021   Acute medial meniscus tear of right knee 01/25/2021   Past Medical History:  Diagnosis Date   Asthma     Family History  Problem Relation Age of Onset   Parkinson's disease Mother    Stroke Father    Early death Father     Past Surgical History:  Procedure Laterality Date    Facial reconstruction due to orbital and maxilla fracture      Social History   Occupational History   Occupation: Interior and spatial designer of operations  Tobacco Use   Smoking status: Never   Smokeless tobacco: Never  Vaping Use   Vaping Use: Never used  Substance and Sexual Activity   Alcohol use: Yes    Alcohol/week: 8.0 standard drinks    Types: 3 Glasses of wine, 5 Cans of beer per week    Comment: 6-7 drinks per week   Drug use: No   Sexual activity: Not on file

## 2021-04-06 ENCOUNTER — Telehealth: Payer: Self-pay | Admitting: Orthopaedic Surgery

## 2021-04-06 NOTE — Telephone Encounter (Signed)
Left message to return call and sch an appt with Dr. Roda Shutters per my chart message of wanting to cover "Post op knee care, Still struggling to descend stairs and would like to discuss cortisone shot as we did in last visit" per [facility]

## 2021-04-20 ENCOUNTER — Other Ambulatory Visit: Payer: Self-pay

## 2021-04-20 ENCOUNTER — Ambulatory Visit (INDEPENDENT_AMBULATORY_CARE_PROVIDER_SITE_OTHER): Payer: BC Managed Care – PPO | Admitting: Orthopaedic Surgery

## 2021-04-20 ENCOUNTER — Encounter: Payer: Self-pay | Admitting: Orthopaedic Surgery

## 2021-04-20 DIAGNOSIS — M25561 Pain in right knee: Secondary | ICD-10-CM

## 2021-04-20 DIAGNOSIS — G8929 Other chronic pain: Secondary | ICD-10-CM | POA: Diagnosis not present

## 2021-04-20 MED ORDER — BUPIVACAINE HCL 0.5 % IJ SOLN
2.0000 mL | INTRAMUSCULAR | Status: AC | PRN
  Administered 2021-04-20: 17:00:00 2 mL via INTRA_ARTICULAR

## 2021-04-20 MED ORDER — LIDOCAINE HCL 1 % IJ SOLN
2.0000 mL | INTRAMUSCULAR | Status: AC | PRN
  Administered 2021-04-20: 17:00:00 2 mL

## 2021-04-20 MED ORDER — METHYLPREDNISOLONE ACETATE 40 MG/ML IJ SUSP
40.0000 mg | INTRAMUSCULAR | Status: AC | PRN
  Administered 2021-04-20: 17:00:00 40 mg via INTRA_ARTICULAR

## 2021-04-20 NOTE — Progress Notes (Signed)
Office Visit Note   Patient: Jack Shea           Date of Birth: 11-Mar-1976           MRN: [id number] Visit Date: 04/20/2021              Requested by: de [place], Raymond J, MD [address] [place],  [place] [address] PCP: de [place], Raymond J, MD   Assessment & Plan: Visit Diagnoses:  1. Chronic pain of right knee     Plan: Jack Shea comes in today for complaints of right knee and patella not tracking well when going down steps.  He feels pain along the lateral patellofemoral articulation.  This only happens when he is stepping down stairs.  When he puts his right hand on the lateral side to stabilize patella the symptoms go away.  Right knee shows trace effusion.  Patella tracking is slightly lateral.  Negative J sign.  I do feel a clunk at the superior lateral corner of the patella as the knee is extended from a flexed position.  Collaterals or cruciates are stable.  No medial joint line tenderness.  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  Possibly synovitis in the area and that is catching between the femur and the patella.  We agreed to try cortisone injection and some physical therapy with kinesiotaping to see if this will improve things.  If not he will follow-up with [place] again.  Follow-Up Instructions: No follow-ups on file.   Orders:  No orders of the defined types were placed in this encounter.  No orders of the defined types were placed in this encounter.     Procedures: Large Joint Inj: R knee on 04/20/2021 4:55 PM Indications: pain Details: 22 G needle  Arthrogram: No  Medications: 40 mg methylPREDNISolone acetate 40 MG/ML; 2 mL lidocaine 1 %; 2 mL bupivacaine 0.5 % Consent was given by the patient. Patient was prepped and draped in the usual sterile fashion.      Clinical Data: No additional findings.   Subjective: Chief Complaint  Patient presents with   Right Knee -  Follow-up    HPI  Review of Systems   Objective: Vital Signs: There were no vitals taken for this visit.  Physical Exam  Ortho Exam  Specialty Comments:  No specialty comments available.  Imaging: No results found.   PMFS History: Patient Active Problem List   Diagnosis Date Noted   DVT (deep venous thrombosis) (HCC) 02/26/2021   Acute medial meniscus tear of right knee 01/25/2021   Past Medical History:  Diagnosis Date   Asthma     Family History  Problem Relation Age of Onset   Parkinson's disease Mother    Stroke Father    Early death Father     Past Surgical History:  Procedure Laterality Date   Facial reconstruction due to orbital and maxilla fracture      Social History   Occupational History   Occupation: Interior and spatial designer of operations  Tobacco Use   Smoking status: Never   Smokeless tobacco: Never  Vaping Use   Vaping Use: Never used  Substance and Sexual Activity   Alcohol use: Yes    Alcohol/week: 8.0 standard drinks    Types: 3 Glasses of wine, 5 Cans of beer per week    Comment: 6-7 drinks per week   Drug use: No   Sexual activity: Not on file

## 2021-04-20 NOTE — Addendum Note (Signed)
Addended by: Mayra Reel on: 04/20/2021 04:59 PM   Modules accepted: Level of Service

## 2021-05-01 ENCOUNTER — Ambulatory Visit (HOSPITAL_BASED_OUTPATIENT_CLINIC_OR_DEPARTMENT_OTHER): Payer: BC Managed Care – PPO | Admitting: Family Medicine

## 2021-05-24 NOTE — Therapy (Addendum)
OUTPATIENT PHYSICAL THERAPY LOWER EXTREMITY EVALUATION   Patient Name: Jack Shea MRN: [id number] DOB:1975/08/05, 46 y.o., male Today's Date: 05/26/2021   PT End of Session - 05/25/21 1250     Visit Number 1    Number of Visits 8    Date for PT Re-Evaluation 07/20/21    PT Start Time 1152    PT Stop Time 1240    PT Time Calculation (min) 48 min    Activity Tolerance Patient tolerated treatment well    Behavior During Therapy [facility] for tasks assessed/performed             Past Medical History:  Diagnosis Date   Asthma    Past Surgical History:  Procedure Laterality Date   Facial reconstruction due to orbital and maxilla fracture      Patient Active Problem List   Diagnosis Date Noted   DVT (deep venous thrombosis) ([facility]) 02/26/2021   Acute medial meniscus tear of right knee 01/25/2021    PCP: [place], Blondell Reveal, MD  REFERRING PROVIDER: Leandrew Koyanagi, MD  REFERRING DIAG:  [phone]) Muscle weakness (generalized)  THERAPY DIAG:  Right knee pain, unspecified chronicity  Muscle weakness (generalized)  ONSET DATE: Surgery on 02/08/2021  SUBJECTIVE:   SUBJECTIVE STATEMENT: -Pt is an avid runner who knelt down to repair some roof shingles and had R knee pain.  He also ran afterwards.  Pt underwent right knee scope with partial medial meniscectomy on 02/08/2021.  Pt had an [place] on 02/15/21 which showed a DVT.  Pt has been on blood thinners and has 5 days left.  He has no restrictions from MD.   -Pt didn't receive any PT but did receive exercises from MD.  He continued to have knee pain and difficulty with descending stairs.  Pt saw MD on 04/20/21 and MD note indicated Patella tracking is slightly lateral and negative J sign.  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  MD gave pt a cortisone injection and ordered PT with kinesiotaping.  PT script indicated VMO strengthening of R knee and K-taping.  Pt reports improved pain with  cortisone injection.    -His biggest issue is walking down the stairs.  He has increased pain with descending stairs though it has improved recently.  Pt has tried jogging one time on TM for 1.5 miles and had no adverse effects.  Pt is not running.  Pt has been rowing 30 mins and elliptical 30 mins for 3x/wk.  Pt has a heavy feeling in superior knee after walking 2 miles.  Pt has some anterior knee pain with ascending stairs though able to perform well.   PERTINENT HISTORY: DVT in R LE in Oct 2022.  PAIN:  Are you having pain? Not at rest NPRS scale: 0/10 current and best; 2/10 worst pain with is going down stairs. Pain location: R inferolateral knee   PRECAUTIONS: Other: DVT dx'd in October 2022  WEIGHT BEARING RESTRICTIONS No  FALLS:  Has patient fallen in last 6 months? 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  Walking an deskwork  PLOF: Independent; Pt would typically run 15-20 miles per week.    PATIENT GOALS return to running and be able to descend stairs without difficulty or pain.    OBJECTIVE:   DIAGNOSTIC FINDINGS: MRI prior to surgery. Pt had a doppler US on 02/15/2021 indicating:  Summary: RIGHT: - Findings consistent with acute  deep vein thrombosis involving the right gastrocnemius veins. - No cystic structure found in the popliteal fossa. LEFT: - No evidence of common femoral vein obstruction.  PATIENT SURVEYS:  FOTO 67 with a goal of 93  COGNITION:  Overall cognitive status: Within functional limits for tasks assessed    OBSERVATION: Portals are well healed    PALPATION: Pt had no tenderness with palpation of R knee and calf.  LE AROM/PROM:  A/PROM Right 05/26/2021 Left 05/26/2021  Hip flexion    Hip extension WNL WNL  Hip abduction WNL WNL  Hip adduction WNL WNL  Hip internal rotation    Hip external rotation    Knee flexion 136 139  Knee extension 0 0  Ankle dorsiflexion    Ankle plantarflexion    Ankle  inversion    Ankle eversion     (Blank rows = not tested)  LE MMT:  MMT Right 05/26/2021 Left 05/26/2021  Hip flexion 5/5 5/5  Hip extension 5/5 4+/5  Hip abduction 5/5 5/5  Hip adduction 4/5 5/5  Hip internal rotation    Hip external rotation 5/5 5/5  Knee flexion 5/5 5/5  Knee extension 5/5 5/5  Ankle dorsiflexion    Ankle plantarflexion    Ankle inversion    Ankle eversion     (Blank rows = not tested)  LOWER EXTREMITY SPECIAL TESTS:  No lateral tracking visible with LAQ though pt feels that it's not smooth.  Pt had slightly improved pain with applying manual pressure to lateral patella though significantly improved pain with applying manual pressure to distal lateral quad and ITB   GAIT:  Assistive device utilized: None Level of assistance: Complete Independence Comments: Pt ambulates with a normalized heel to toe gait without limping.  He does state he can feel it in his inferolateral R knee.      TODAY'S TREATMENT: Pt performed supine SLR with ER 2x10 reps and hooklying hip add with 5 sec hold x 10 reps.  Unable to perform s/l hip add with correct form.  Pt demonstrated the ITB stretch that he typically does which is in S/L'ing. He has not been performing it lately.  PT instructed pt to start back stretching his ITB with his normal stretch.    PATIENT EDUCATION:  Education details: Pt received a HEP handout and was educated in correct form and appropriate frequency.  Pt instructed to perform S/L ITB stretch as well.  POC, objective findings, rationale of Rx.  Person educated: Patient Education method: Explanation, Demonstration, Tactile cues, Verbal cues, and Handouts Education comprehension: verbalized understanding, returned demonstration, verbal cues required, tactile cues required, and needs further education   HOME EXERCISE PROGRAM: Access Code: [email or web address] URL: [email or web address] Date: 05/26/2021 Prepared by: Ronny Flurry  Exercises Straight Leg Raise with External Rotation - 2 x daily - 5-6 x weekly - 2 sets - 10 reps Supine Hip Adduction Isometric with Ball - 2 x daily - 5-6 x weekly - 1 sets - 10 reps - 5s second hold   ASSESSMENT:  CLINICAL IMPRESSION: Patient is a 46 y.o. male 15 weeks and 1 day s/p R knee medial meniscectomy presenting to the clinic with R knee pain and R LE muscle weakness.  Pt was found to have a DVT in October and has been on blood thinners since.  He has no restrictions from MD.  Pt has good ROM in R knee and good strength t/o R LE except some weakness in R hip adduction.  Pt had significantly  improved pain with LAQ with applying manual pressure to distal lateral quad and ITB.  Pt has some anterior knee pain with ascending stairs though able to perform well.  His biggest c/o is difficulty with and increased pain with descending stairs which has improved recently.  Pt is avid runner though is not running currently.  He is working out in [employer].  Pt should benefit from skilled PT services to address impairments and goals and to assist in restoring PLOF.   Objective impairments include decreased activity tolerance, decreased mobility, decreased strength, and pain. These impairments are limiting patient from  descending stairs and running . Personal factors including 1 comorbidity: DVT  are also affecting patient's functional outcome. Patient will benefit from skilled PT to address above impairments and improve overall function.  REHAB POTENTIAL: Good  CLINICAL DECISION MAKING: Stable/uncomplicated  EVALUATION COMPLEXITY: Low   GOALS:   SHORT TERM GOALS:  STG Name Target Date Goal status  1 Pt will be independent and compliant with HEP for improved pain, strength, and function.  Baseline:  06/15/2021 INITIAL  2 Pt will score 0-1 on the lateral step down test on a 4 inch step for improved quad eccentric control and performance of stairs.  Baseline:  06/22/2021 INITIAL                             LONG TERM GOALS:   LTG Name Target Date Goal status  1 Pt will score 0-1 on the lateral step down test on a 6 inch step for improved quad eccentric control and performance of stairs. Baseline: 07/20/2021 INITIAL  2 Pt will demo improved hip adduction strength to 5/5 MMT for improved strength in hip and to assist in restoring PLOF.  Baseline: 07/20/2021 INITIAL  3 Pt will be able to perform stairs without any pain or difficulty.  Baseline: 07/20/2021 INITIAL                       PLAN: PT FREQUENCY: 1x/week  PT DURATION: 8 weeks  PLANNED INTERVENTIONS: Therapeutic exercises, Therapeutic activity, Neuro Muscular re-education, Gait training, Patient/Family education, Joint mobilization, Stair training, Aquatic Therapy, Electrical stimulation, Cryotherapy, Moist heat, Taping, Ultrasound, and Manual therapy  PLAN FOR NEXT SESSION: Pt states he will return in a couple of weeks.  Review HEP.  Assess if pt can perform S/L hip add.  Add heel taps.   Selinda Michaels III PT, DPT 05/26/21 9:37 AM  PHYSICAL THERAPY DISCHARGE SUMMARY  Visits from Start of Care: 1  Current functional level related to goals / functional outcomes: Unable to assess current functional status or goals due to pt not being present at discharge.    Remaining deficits: Unable to assess due to pt not being present at discharge.    Education / Equipment: See above   Patient was evaluated on 05/25/2021 and received a HEP.  The plan was for pt to return in a couple of weeks.  PT has not heard from pt and will be considered discharged at this time.

## 2021-05-25 ENCOUNTER — Other Ambulatory Visit: Payer: Self-pay

## 2021-05-25 ENCOUNTER — Ambulatory Visit (HOSPITAL_BASED_OUTPATIENT_CLINIC_OR_DEPARTMENT_OTHER): Payer: BC Managed Care – PPO | Attending: Orthopaedic Surgery | Admitting: Physical Therapy

## 2021-05-25 ENCOUNTER — Encounter (HOSPITAL_BASED_OUTPATIENT_CLINIC_OR_DEPARTMENT_OTHER): Payer: Self-pay | Admitting: Physical Therapy

## 2021-05-25 DIAGNOSIS — M6281 Muscle weakness (generalized): Secondary | ICD-10-CM | POA: Insufficient documentation

## 2021-05-25 DIAGNOSIS — M25561 Pain in right knee: Secondary | ICD-10-CM | POA: Diagnosis not present

## 2021-07-06 ENCOUNTER — Other Ambulatory Visit: Payer: Self-pay

## 2021-07-06 ENCOUNTER — Encounter: Payer: Self-pay | Admitting: Orthopaedic Surgery

## 2021-07-06 ENCOUNTER — Ambulatory Visit (INDEPENDENT_AMBULATORY_CARE_PROVIDER_SITE_OTHER): Payer: BC Managed Care – PPO | Admitting: Orthopaedic Surgery

## 2021-07-06 DIAGNOSIS — S83241A Other tear of medial meniscus, current injury, right knee, initial encounter: Secondary | ICD-10-CM

## 2021-07-06 DIAGNOSIS — Z9889 Other specified postprocedural states: Secondary | ICD-10-CM | POA: Diagnosis not present

## 2021-07-06 DIAGNOSIS — G8929 Other chronic pain: Secondary | ICD-10-CM

## 2021-07-06 DIAGNOSIS — M25561 Pain in right knee: Secondary | ICD-10-CM

## 2021-07-06 NOTE — Progress Notes (Signed)
? ?  Office Visit Note ?  ?Patient: Jack Shea           ?Date of Birth: March 18, 1976           ?MRN: [id number] ?Visit Date: 07/06/2021 ?             ?Requested by: de [place], Raymond J, MD ?[phone] ?[place],  [place] [address] ?PCP: de [place], Raymond J, MD ? ? ?Assessment & Plan: ?Visit Diagnoses:  ?1. S/P right knee arthroscopy   ?2. Acute medial meniscus tear of right knee, initial encounter   ?3. Chronic pain of right knee   ? ? ?Plan: Other is now 6 months status post partial medial meniscectomy of the right knee.  He continues to have pain along the anterior lateral aspect of the knee when going downstairs when the right knee is uphill.  He no longer has the pain from the meniscus tear.  We have tried various methods to improve this pain but he continues to have symptoms. ? ?Examination of the right knee is unchanged. ? ?At this point given lack of improvement from 6 months of conservative management we will need an MRI to rule out structural abnormalities.  Hopefully will be able to figure out why he is having this pain.  I will call the patient regarding the MRI results. ? ?Follow-Up Instructions: No follow-ups on file.  ? ?Orders:  ?Orders Placed This Encounter  ?Procedures  ? MR Knee Right w/o contrast  ? ?No orders of the defined types were placed in this encounter. ? ? ? ? Procedures: ?No procedures performed ? ? ?Clinical Data: ?No additional findings. ? ? ?Subjective: ?Chief Complaint  ?Patient presents with  ? Right Knee - Follow-up, Pain  ? ? ?HPI ? ?Review of Systems ? ? ?Objective: ?Vital Signs: There were no vitals taken for this visit. ? ?Physical Exam ? ?Ortho Exam ? ?Specialty Comments:  ?No specialty comments available. ? ?Imaging: ?No results found. ? ? ?PMFS History: ?Patient Active Problem List  ? Diagnosis Date Noted  ? DVT (deep venous thrombosis) (HCC) 02/26/2021  ? Acute medial meniscus tear of right knee 01/25/2021  ? ?Past Medical History:  ?Diagnosis Date  ? Asthma   ?   ?Family History  ?Problem Relation Age of Onset  ? Parkinson's disease Mother   ? Stroke Father   ? Early death Father   ?  ?Past Surgical History:  ?Procedure Laterality Date  ? Facial reconstruction due to orbital and maxilla fracture     ? ?Social History  ? ?Occupational History  ? Occupation: Interior and spatial designer of operations  ?Tobacco Use  ? Smoking status: Never  ? Smokeless tobacco: Never  ?Vaping Use  ? Vaping Use: Never used  ?Substance and Sexual Activity  ? Alcohol use: Yes  ?  Alcohol/week: 8.0 standard drinks  ?  Types: 3 Glasses of wine, 5 Cans of beer per week  ?  Comment: 6-7 drinks per week  ? Drug use: No  ? Sexual activity: Not on file  ? ? ? ? ? ? ?

## 2021-07-15 ENCOUNTER — Other Ambulatory Visit: Payer: Self-pay

## 2021-07-15 ENCOUNTER — Ambulatory Visit
Admission: RE | Admit: 2021-07-15 | Discharge: 2021-07-15 | Disposition: A | Discharge status: Home / Self Care | Payer: BC Managed Care – PPO | Source: Ambulatory Visit | Attending: Orthopaedic Surgery | Admitting: Orthopaedic Surgery

## 2021-07-15 DIAGNOSIS — Z9889 Other specified postprocedural states: Secondary | ICD-10-CM

## 2021-07-15 DIAGNOSIS — G8929 Other chronic pain: Secondary | ICD-10-CM | POA: Diagnosis not present

## 2021-07-15 DIAGNOSIS — M25461 Effusion, right knee: Secondary | ICD-10-CM | POA: Diagnosis not present

## 2021-07-15 DIAGNOSIS — R600 Localized edema: Secondary | ICD-10-CM | POA: Diagnosis not present

## 2021-07-18 NOTE — Progress Notes (Signed)
Spoke to patient about results.  He will take nsaids x 4 weeks and back off on activity.  Follow up as needed.

## 2021-10-12 ENCOUNTER — Encounter (HOSPITAL_BASED_OUTPATIENT_CLINIC_OR_DEPARTMENT_OTHER): Payer: Self-pay | Admitting: Family Medicine

## 2021-10-12 ENCOUNTER — Ambulatory Visit (INDEPENDENT_AMBULATORY_CARE_PROVIDER_SITE_OTHER): Payer: BC Managed Care – PPO | Admitting: Family Medicine

## 2021-10-12 DIAGNOSIS — R070 Pain in throat: Secondary | ICD-10-CM | POA: Diagnosis not present

## 2021-10-12 NOTE — Assessment & Plan Note (Addendum)
Patient reports that for the past few months he has been having some right-sided throat pain.  He notes that this started after recent coronavirus infection during which time he did have notable not coughing.  Pain started with this extensive coughing and has been worsened with hot liquids, coughing.  He also noticed worsening 1 night when he had been singing a lot.  Rarely when he has been coughing recently has noticed a small amount of blood He denies any recent issues with fevers, chills, night sweats, weight loss Has had some associated right-sided dental pain in the general area of right lower molars On exam today, right auditory canal is normal with a small amount of cerumen noted, normal-appearing tympanic membrane.  Oropharyngeal exam with large tonsils, however no significant pharyngeal erythema, does appear to have some caries.  Palpation along anterior neck without significant abnormality, no appreciable tenderness to palpation, no lymphadenopathy Given duration of symptoms, would proceed with referral to ENT for further evaluation and with occasional hemoptysis Would also recommend establishing with a dentist for further evaluation.  It is possible that the throat pain and dental pain are separate issues, however there is a possibility of some extension given proximity and thus 2 problems could be related We will plan for follow-up as needed regarding this issue

## 2021-10-12 NOTE — Patient Instructions (Signed)
  Medication Instructions:  Your physician recommends that you continue on your current medications as directed. Please refer to the Current Medication list given to you today. --If you need a refill on any your medications before your next appointment, please call your pharmacy first. If no refills are authorized on file call the office.-- Lab Work: Your physician has recommended that you have lab work today: NO If you have labs (blood work) drawn today and your tests are completely normal, you will receive your results via MyChart message OR a phone call from our staff.  Please ensure you check your voicemail in the event that you authorized detailed messages to be left on a delegated number. If you have any lab test that is abnormal or we need to change your treatment, we will call you to review the results.  Referrals/Procedures/Imaging: ENT  Follow-Up: Your next appointment:   Your physician recommends that you schedule a follow-up appointment as needed with Dr. de [place].  You will receive a text message or e-mail with a link to a survey about your care and experience with [place] today! We would greatly appreciate your feedback!   Thanks for letting us be apart of your health journey!!  Primary Care and Sports Medicine   Dr. Ceasar Mons [place]   We encourage you to activate your patient portal called "MyChart".  Sign up information is provided on this After Visit Summary.  MyChart is used to connect with patients for Virtual Visits (Telemedicine).  Patients are able to view lab/test results, encounter notes, upcoming appointments, etc.  Non-urgent messages can be sent to your provider as well. To learn more about what you can do with MyChart, please visit --  [email or web address].

## 2021-10-12 NOTE — Progress Notes (Signed)
**Note Shea-Identified via Obfuscation**     Procedures performed today:    None.  Independent interpretation of notes and tests performed by another provider:   None.  Brief History, Exam, Impression, and Recommendations:    BP (!) 143/90   Pulse 61   Ht 6\' 3"  (1.905 m)   Wt 200 lb 6.4 oz (90.9 kg)   SpO2 100%   BMI 25.05 kg/m   Throat pain Patient reports that for the past few months he has been having some right-sided throat pain.  He notes that this started after recent coronavirus infection during which time he did have notable not coughing.  Pain started with this extensive coughing and has been worsened with hot liquids, coughing.  He also noticed worsening 1 night when he had been singing a lot.  Rarely when he has been coughing recently has noticed a small amount of blood He denies any recent issues with fevers, chills, night sweats, weight loss Has had some associated right-sided dental pain in the general area of right lower molars On exam today, right auditory canal is normal with a small amount of cerumen noted, normal-appearing tympanic membrane.  Oropharyngeal exam with large tonsils, however no significant pharyngeal erythema, does appear to have some caries.  Palpation along anterior neck without significant abnormality, no appreciable tenderness to palpation, no lymphadenopathy Given duration of symptoms, would proceed with referral to ENT for further evaluation and with occasional hemoptysis Would also recommend establishing with a dentist for further evaluation.  It is possible that the throat pain and dental pain are separate issues, however there is a possibility of some extension given proximity and thus 2 problems could be related We will plan for follow-up as needed regarding this issue  Return if symptoms worsen or fail to improve.   ___________________________________________ Jack Shea , MD, ABFM, CAQSM Primary Care and Sports Medicine [facility]

## 2021-10-15 ENCOUNTER — Ambulatory Visit (HOSPITAL_BASED_OUTPATIENT_CLINIC_OR_DEPARTMENT_OTHER): Payer: BC Managed Care – PPO | Admitting: Family Medicine

## 2021-12-25 DIAGNOSIS — J392 Other diseases of pharynx: Secondary | ICD-10-CM | POA: Diagnosis not present

## 2021-12-25 DIAGNOSIS — R059 Cough, unspecified: Secondary | ICD-10-CM | POA: Diagnosis not present

## 2022-07-08 IMAGING — MR MR KNEE*R* W/O CM
4 of 7 series · 21 of 40 positions shown · non-contrast
Comparison: MR knee 01/21/2021; X-ray knee 12/29/2020.

CLINICAL DATA: Chronic knee pain with status post knee arthroscopy.
History of torn meniscus surgery February 08, 2021

EXAM:
MRI OF THE RIGHT KNEE WITHOUT CONTRAST
TECHNIQUE: Multiplanar, multisequence MR imaging of the knee was performed. No
intravenous contrast was administered.

[Series 3: T2 fat-sat · axial · 4.0mm · 0.50mm/px · z∈[-63,+47]mm · 4 of 23 slices shown]
[im 1/23]
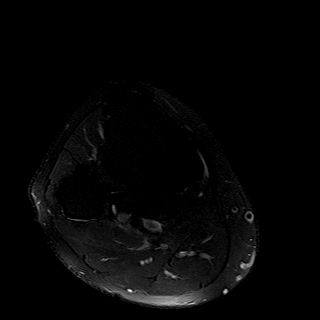
[im 6/23]
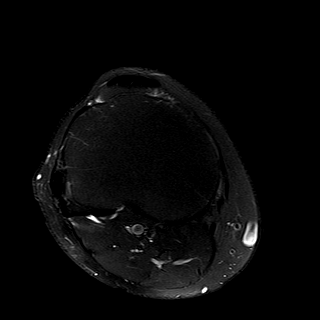
[im 12/23]
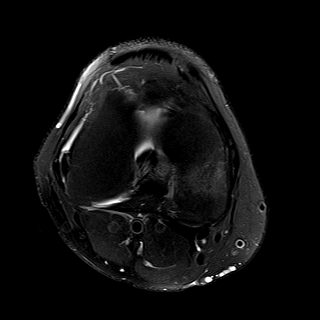
[im 23/23]
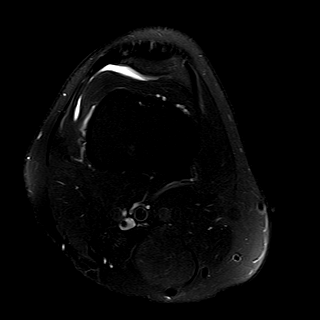

[Series 7: PD fat-sat · sagittal · 3.0mm · 0.29mm/px · 7 of 27 slices shown (1 of 3)]
[im 1/27]
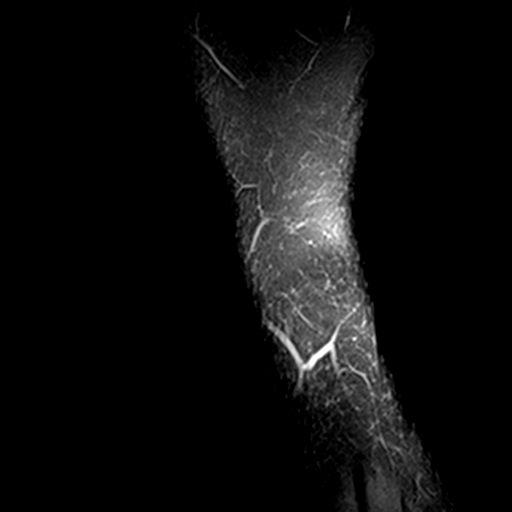
[im 5/27]
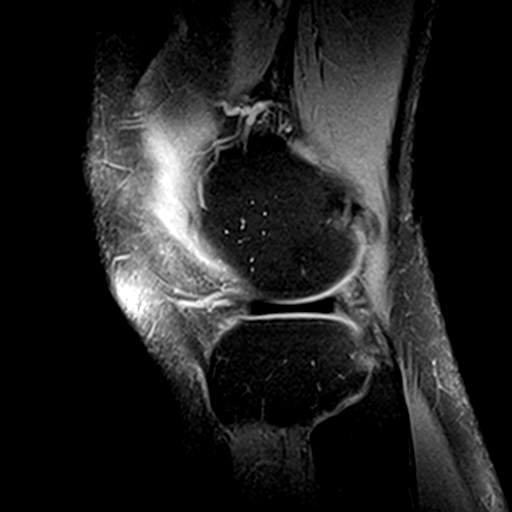
[im 9/27]
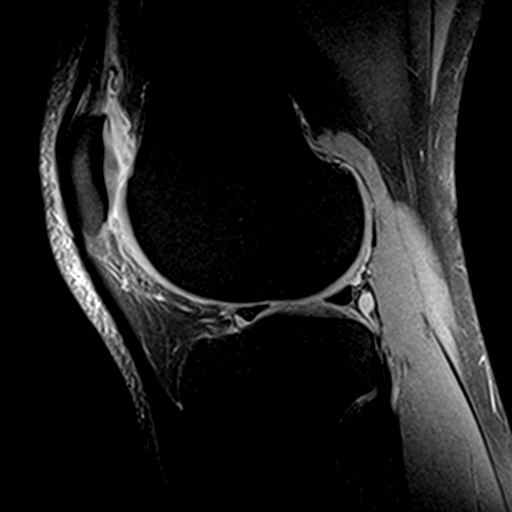
[im 14/27]
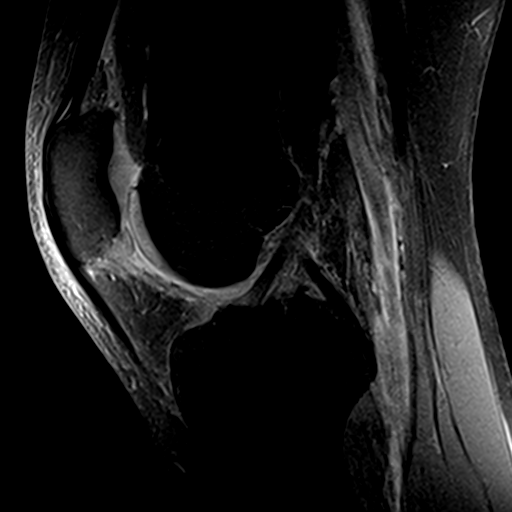
[im 18/27]
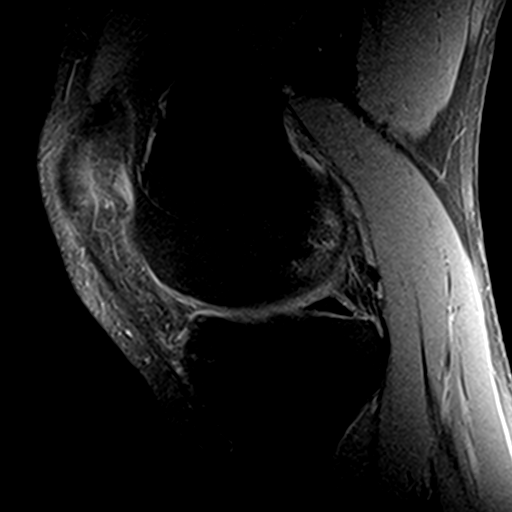
[im 22/27]
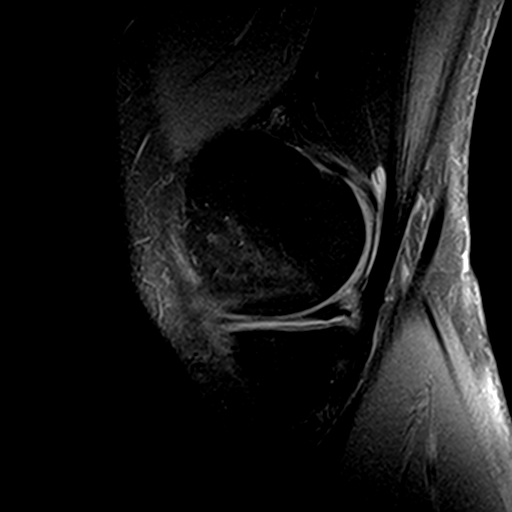
[im 27/27]
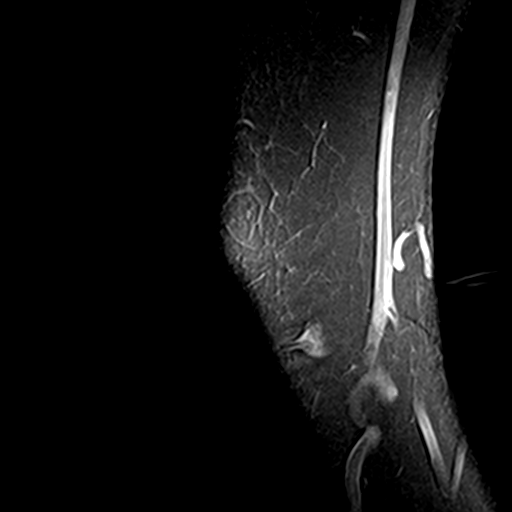

[Series 8: PD fat-sat · coronal · 3.0mm · 0.29mm/px · 7 of 30 slices shown (2 of 3)]
[im 1/30]
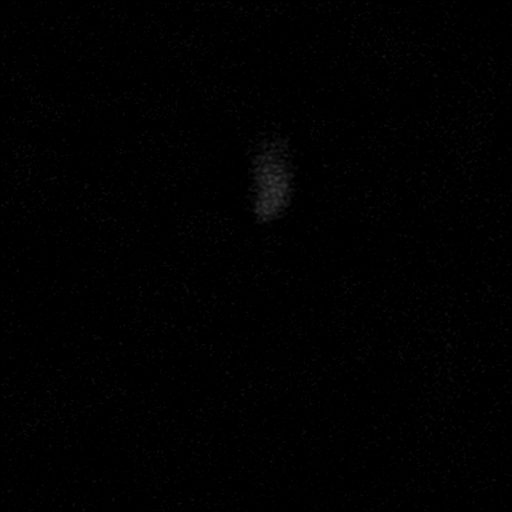
[im 5/30]
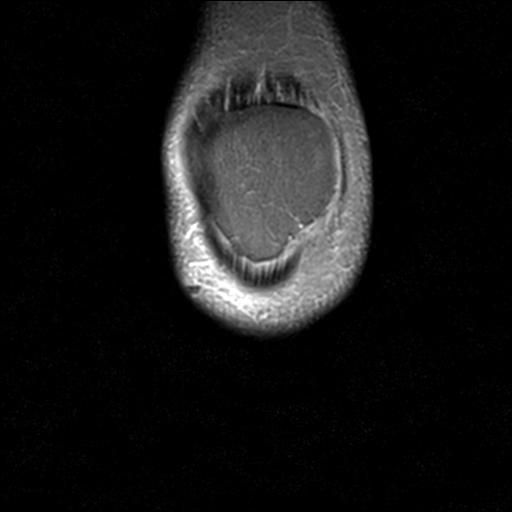
[im 10/30]
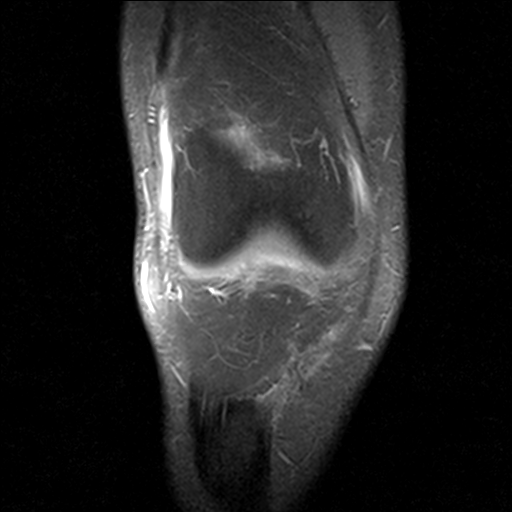
[im 15/30]
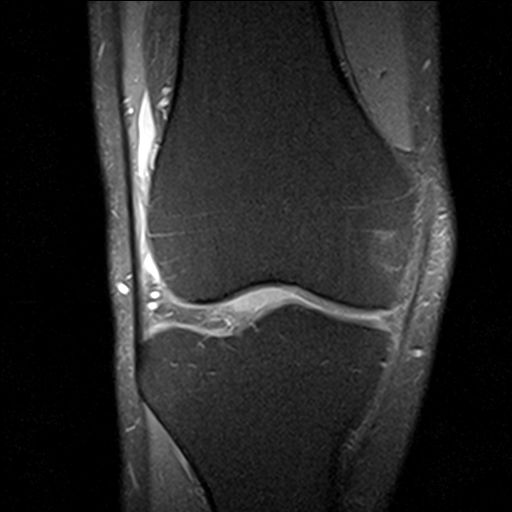
[im 20/30]
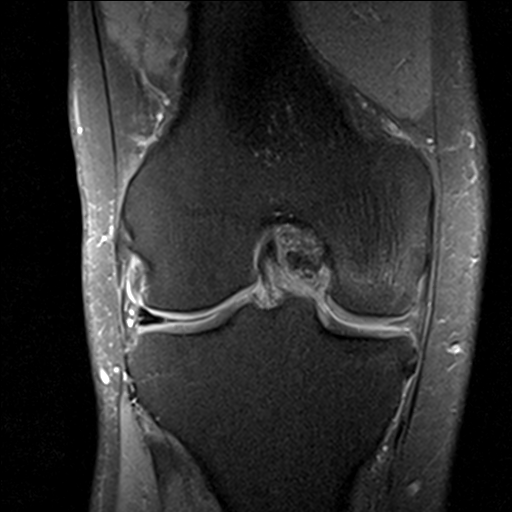
[im 25/30]
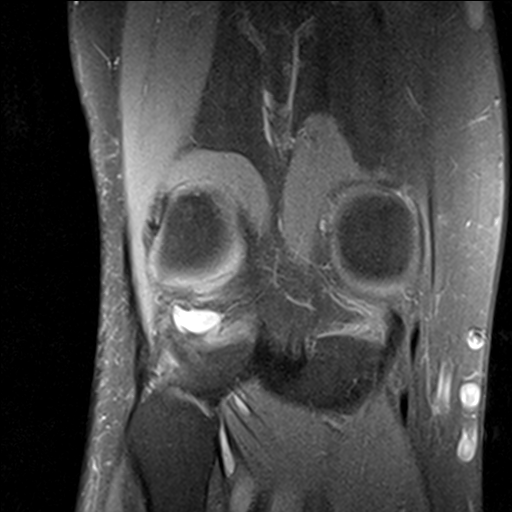
[im 30/30]
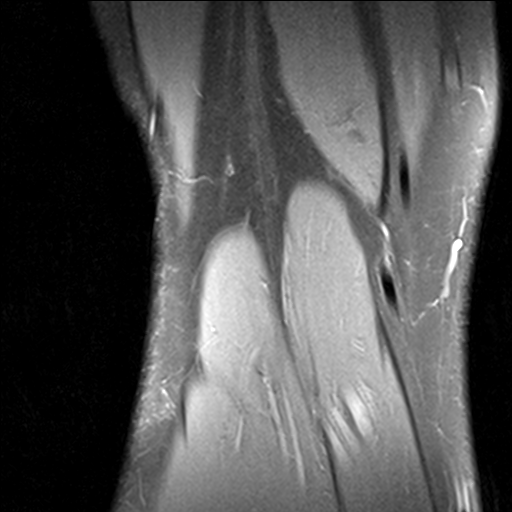

[Series 9: PD fat-sat · coronal · 1.5mm · 0.29mm/px · 3 of 11 slices shown (3 of 3)]
[im 1/11]
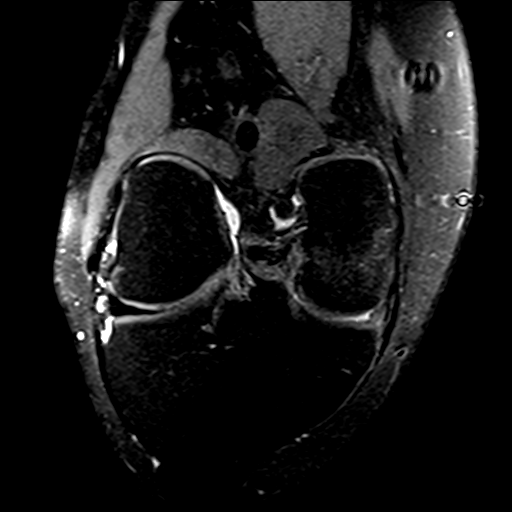
[im 6/11]
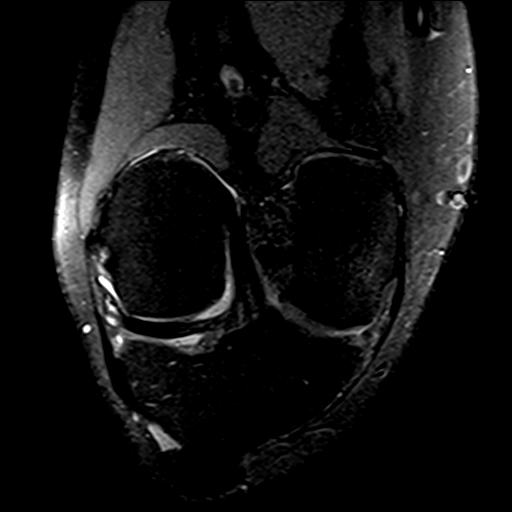
[im 11/11]
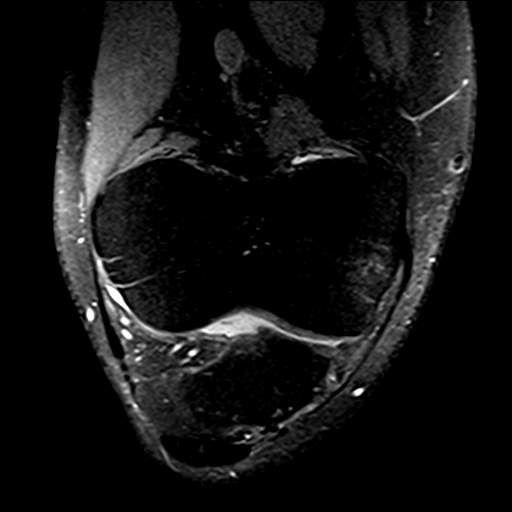

[21 of 40 positions shown; findings below may reference images not displayed]

FINDINGS: MENISCI

Medial meniscus: Diminutive appearance of the medial meniscus
compatible with history of partial meniscectomy. Anterior and
posterior root attachment sites intact.

Lateral meniscus:  Intact.

LIGAMENTS

Cruciates: Intact ACL and PCL.

Collaterals: Intact MCL. Lateral collateral ligament complex intact.

CARTILAGE

Patellofemoral:  No chondral defect.

Medial: Similar degree of chondral thinning and surface irregularity
within the weight-bearing medial compartment without new chondral
defect.

Lateral: Similar degree of mild chondral thinning of the lateral
compartment.

MISCELLANEOUS

Joint:  Small joint effusion. Fat pads within normal limits.

Popliteal Fossa:  No Baker's cyst. Intact popliteus tendon.

Extensor Mechanism:  Intact quadriceps and patellar tendons.

Bones: No acute fracture. No dislocation. Patchy bone marrow edema
within the medial femoral condyle, increased from prior. No
well-defined subchondral fracture line. No marrow replacing bone
lesion.

Other: No significant periarticular soft tissue findings.
IMPRESSION: 1. Patchy bone marrow edema within the medial femoral condyle,
increased from prior. No well-defined subchondral fracture line.
Findings may reflect stress related changes or developing
insufficiency fracture.
2. Diminutive appearance of the medial meniscus compatible with
history of partial meniscectomy. No definite findings to suggest new
or recurrent tear.
3. Similar degree of tibia femoral compartment osteoarthritis.
4. Small joint effusion.
5. Intact cruciate and collateral ligaments.

## 2022-07-10 ENCOUNTER — Encounter (HOSPITAL_BASED_OUTPATIENT_CLINIC_OR_DEPARTMENT_OTHER): Payer: Self-pay

## 2023-02-24 ENCOUNTER — Ambulatory Visit (INDEPENDENT_AMBULATORY_CARE_PROVIDER_SITE_OTHER): Payer: 59 | Admitting: Family Medicine

## 2023-02-24 ENCOUNTER — Encounter (HOSPITAL_BASED_OUTPATIENT_CLINIC_OR_DEPARTMENT_OTHER): Payer: Self-pay | Admitting: Family Medicine

## 2023-02-24 ENCOUNTER — Ambulatory Visit (HOSPITAL_BASED_OUTPATIENT_CLINIC_OR_DEPARTMENT_OTHER): Payer: BC Managed Care – PPO | Admitting: Family Medicine

## 2023-02-24 VITALS — BP 143/95 | HR 76 | Wt 199.0 lb

## 2023-02-24 DIAGNOSIS — D229 Melanocytic nevi, unspecified: Secondary | ICD-10-CM | POA: Diagnosis not present

## 2023-02-24 NOTE — Patient Instructions (Signed)

## 2023-02-24 NOTE — Progress Notes (Signed)
   Acute Office Visit  Subjective:     Patient ID: Jack Shea, male    DOB: [birth year], 47 y.o.   MRN: [id number]  Chief Complaint  Patient presents with   Leg Pain    Mole on left calf, fairly new, has some itching intermittently    Jack Shea is a 47 year old male patient who presents today for concerns regarding a mole that has increased in size over the past few months. It went from a freckle/thought it was an infected hair follicle. Reports it has felt itchy.   Asymmetry- does not appear asymmetrical   Border- not defined  Color- reports darkening  Diameter- increased in size    Review of Systems  Constitutional:  Negative for malaise/fatigue.  Respiratory:  Negative for cough and shortness of breath.   Cardiovascular:  Negative for chest pain and leg swelling.  Gastrointestinal:  Negative for abdominal pain, nausea and vomiting.  Musculoskeletal:  Negative for myalgias.  Skin:  Positive for itching (new mole on L leg).  Neurological:  Negative for dizziness, weakness and headaches.  Psychiatric/Behavioral:  Negative for depression and suicidal ideas. The patient is not nervous/anxious and does not have insomnia.        Objective:    BP (!) 143/95   Pulse 76   Wt 199 lb (90.3 kg)   SpO2 100%   BMI 24.87 kg/m   Physical Exam Constitutional:      Appearance: Normal appearance.  Cardiovascular:     Rate and Rhythm: Normal rate and regular rhythm.     Pulses: Normal pulses.     Heart sounds: Normal heart sounds.  Pulmonary:     Effort: Pulmonary effort is normal.     Breath sounds: Normal breath sounds.  Skin:    Findings: Lesion (small mole on L leg) present.  Neurological:     Mental Status: He is alert.  Psychiatric:        Mood and Affect: Mood normal.        Behavior: Behavior normal.    Assessment & Plan:   1. Atypical mole Patient is a 47 year old male patient who presents today for concerns regarding a small mole on the posterior aspect  of his left leg. He reports when he first noticed this, he thought it was an infected follicle or small freckle. He reports that it has increased in size, color, and shape and would like to have this further assessed. Referral sent to dermatology. Advised patient to schedule with them asap.  - Ambulatory referral to Dermatology   Return if symptoms worsen or fail to improve.  Alyson Reedy, FNP

## 2023-02-27 ENCOUNTER — Ambulatory Visit: Payer: 59 | Admitting: Dermatology

## 2023-02-27 ENCOUNTER — Encounter: Payer: Self-pay | Admitting: Dermatology

## 2023-02-27 DIAGNOSIS — D225 Melanocytic nevi of trunk: Secondary | ICD-10-CM

## 2023-02-27 DIAGNOSIS — D492 Neoplasm of unspecified behavior of bone, soft tissue, and skin: Secondary | ICD-10-CM | POA: Diagnosis not present

## 2023-02-27 DIAGNOSIS — D2372 Other benign neoplasm of skin of left lower limb, including hip: Secondary | ICD-10-CM

## 2023-02-27 DIAGNOSIS — D239 Other benign neoplasm of skin, unspecified: Secondary | ICD-10-CM

## 2023-02-27 NOTE — Patient Instructions (Addendum)
DERMATOFIBROMA left lower leg A dermatofibroma is a benign growth possibly related to trauma, such as an insect bite, cut from shaving, or inflamed acne-type bump.  Treatment options to remove include shave or excision with resulting scar and risk of recurrence.  Since benign-appearing and not bothersome, will observe for now.    Wound Care Instructions  Cleanse wound gently with soap and water once a day then pat dry with clean gauze. Apply a thin coat of Petrolatum (petroleum jelly, "Vaseline") over the wound (unless you have an allergy to this). We recommend that you use a new, sterile tube of Vaseline. Do not pick or remove scabs. Do not remove the yellow or white "healing tissue" from the base of the wound.  Cover the wound with fresh, clean, nonstick gauze and secure with paper tape. You may use Band-Aids in place of gauze and tape if the wound is small enough, but would recommend trimming much of the tape off as there is often too much. Sometimes Band-Aids can irritate the skin.  You should call the office for your biopsy report after 1 week if you have not already been contacted.  If you experience any problems, such as abnormal amounts of bleeding, swelling, significant bruising, significant pain, or evidence of infection, please call the office immediately.  FOR ADULT SURGERY PATIENTS: If you need something for pain relief you may take 1 extra strength Tylenol (acetaminophen) AND 2 Ibuprofen (200mg  each) together every 4 hours as needed for pain. (do not take these if you are allergic to them or if you have a reason you should not take them.) Typically, you may only need pain medication for 1 to 3 days.     Due to recent changes in healthcare laws, you may see results of your pathology and/or laboratory studies on MyChart before the doctors have had a chance to review them. We understand that in some cases there may be results that are confusing or concerning to you. Please understand that  not all results are received at the same time and often the doctors may need to interpret multiple results in order to provide you with the best plan of care or course of treatment. Therefore, we ask that you please give [place] 2 business days to thoroughly review all your results before contacting the office for clarification. Should we see a critical lab result, you will be contacted sooner.   If You Need Anything After Your Visit  If you have any questions or concerns for your doctor, please call our main line at [phone] and press option 4 to reach your doctor's medical assistant. If no one answers, please leave a voicemail as directed and we will return your call as soon as possible. Messages left after 4 pm will be answered the following business day.   You may also send [place] a message via MyChart. We typically respond to MyChart messages within 1-2 business days.  For prescription refills, please ask your pharmacy to contact our office. Our fax number is [phone].  If you have an urgent issue when the clinic is closed that cannot wait until the next business day, you can page your doctor at the number below.    Please note that while we do our best to be available for urgent issues outside of office hours, we are not available 24/7.   If you have an urgent issue and are unable to reach [place], you may choose to seek medical care at your doctor's office, retail  clinic, urgent care center, or emergency room.  If you have a medical emergency, please immediately call 911 or go to the emergency department.  Pager Numbers  - Dr. Gwen Pounds: [phone]  - Dr. Roseanne Reno: [phone]  - Dr. Katrinka Blazing: [phone]   In the event of inclement weather, please call our main line at [phone] for an update on the status of any delays or closures.  Dermatology Medication Tips: Please keep the boxes that topical medications come in in order to help keep track of the instructions about where and how  to use these. Pharmacies typically print the medication instructions only on the boxes and not directly on the medication tubes.   If your medication is too expensive, please contact our office at [phone] option 4 or send [place] a message through MyChart.   We are unable to tell what your co-pay for medications will be in advance as this is different depending on your insurance coverage. However, we may be able to find a substitute medication at lower cost or fill out paperwork to get insurance to cover a needed medication.   If a prior authorization is required to get your medication covered by your insurance company, please allow [place] 1-2 business days to complete this process.  Drug prices often vary depending on where the prescription is filled and some pharmacies may offer cheaper prices.  The website www.goodrx.com contains coupons for medications through different pharmacies. The prices here do not account for what the cost may be with help from insurance (it may be cheaper with your insurance), but the website can give you the price if you did not use any insurance.  - You can print the associated coupon and take it with your prescription to the pharmacy.  - You may also stop by our office during regular business hours and pick up a GoodRx coupon card.  - If you need your prescription sent electronically to a different pharmacy, notify our office through [facility] or by phone at [phone] option 4.     Si Usted Necesita Algo Despus de Su Visita  Tambin puede enviarnos un mensaje a travs de Clinical cytogeneticist. Por lo general respondemos a los mensajes de MyChart en el transcurso de 1 a 2 das hbiles.  Para renovar recetas, por favor pida a su farmacia que se ponga en contacto con nuestra oficina. Annie Sable de fax es [place] [phone].  Si tiene un asunto urgente cuando la clnica est cerrada y que no puede esperar hasta el siguiente da hbil, puede llamar/localizar a su  doctor(a) al nmero que aparece a continuacin.   Por favor, tenga en cuenta que aunque hacemos todo lo posible para estar disponibles para asuntos urgentes fuera del horario de [place], no estamos disponibles las 24 horas del da, los 7 [address]  [address] de la [place].   Si tiene un problema urgente y no puede comunicarse con nosotros, puede optar por buscar atencin mdica  en el consultorio de su doctor(a), en una clnica privada, en un centro de atencin urgente o en una sala de emergencias.  Si tiene Engineer, drilling, por favor llame inmediatamente al 911 o vaya a la sala de emergencias.  Nmeros de bper  - Dr. Gwen Pounds: [phone]  - Dra. Roseanne Reno: [phone]  - Dr. Katrinka Blazing: [phone]   En caso de inclemencias del tiempo, por favor llame a Lacy Duverney principal al [phone] para una actualizacin sobre el [place] de cualquier retraso o cierre.  Consejos para la medicacin en dermatologa: Por favor,  guarde las cajas en las que vienen los medicamentos de uso tpico para ayudarle a seguir las instrucciones sobre dnde y cmo usarlos. Las farmacias generalmente imprimen las instrucciones del medicamento slo en las cajas y no directamente en los tubos del [place].   Si su medicamento es muy caro, por favor, pngase en contacto con Rolm Gala llamando al [phone] y presione la opcin 4 o envenos un mensaje a travs de Clinical cytogeneticist.   No podemos decirle cul ser su copago por los medicamentos por adelantado ya que esto es diferente dependiendo de la cobertura de su seguro. Sin embargo, es posible que podamos encontrar un medicamento sustituto a Audiological scientist un formulario para que el seguro cubra el medicamento que se considera necesario.   Si se requiere una autorizacin previa para que su compaa de seguros [place] su medicamento, por favor permtanos de 1 a 2 das hbiles para completar [address]  Los precios de los medicamentos varan con frecuencia dependiendo del  Environmental consultant de dnde se surte la receta y alguna farmacias pueden ofrecer precios ms baratos.  El sitio web www.goodrx.com tiene cupones para medicamentos de Health and safety inspector. Los precios aqu no tienen en cuenta lo que podra costar con la ayuda del seguro (puede ser ms barato con su seguro), pero el sitio web puede darle el precio si no utiliz Tourist information centre manager.  - Puede imprimir el cupn correspondiente y llevarlo con su receta a la farmacia.  - Tambin puede pasar por nuestra oficina durante el horario de atencin regular y Education officer, museum una tarjeta de cupones de GoodRx.  - Si necesita que su receta se enve electrnicamente a una farmacia diferente, informe a nuestra oficina a travs de MyChart de [place] o por telfono llamando al [phone] y presione la opcin 4.

## 2023-02-27 NOTE — Progress Notes (Signed)
   New Patient Visit   Subjective  Jack Shea is a 47 y.o. male who presents for the following: check growth L calf, started out as itchy brown spot and pt has noticed it has grown, check mole back, gets irritated when running, no hx of skin ca, no fhx of skin ca The patient has spots, moles and lesions to be evaluated, some may be new or changing and the patient may have concern these could be cancer.  New patient referral from Dr. Marcy Salvo de [place]. The following portions of the chart were reviewed this encounter and updated as appropriate: medications, allergies, medical history  Review of Systems:  No other skin or systemic complaints except as noted in HPI or Assessment and Plan.  Objective  Well appearing patient in no apparent distress; mood and affect are within normal limits.   A focused examination was performed of the following areas: L lower leg, back  Relevant exam findings are noted in the Assessment and Plan.  R low back 10.0 x 8.0 mm pigmented patch       Assessment & Plan   DERMATOFIBROMA L medial calf Exam: Firm pink/brown papulenodule with slight scaling. Treatment Plan: A dermatofibroma is a benign growth possibly related to trauma, such as an insect bite, cut from shaving, or inflamed acne-type bump.  Treatment options to remove include shave or excision with resulting scar and risk of recurrence.  Since benign-appearing and not bothersome, will observe for now.   Recheck in 3 months to ensure it is a DF  MELANOCYTIC NEVI Back, abdomen Exam: Tan-brown and/or pink-flesh-colored symmetric macules and papules  Treatment Plan: Benign appearing on exam today. Recommend observation. Call clinic for new or changing moles. Recommend daily use of broad spectrum spf 30+ sunscreen to sun-exposed areas.    Neoplasm of skin R low back  Skin / nail biopsy Type of biopsy: tangential   Informed consent: discussed and consent obtained   Timeout: patient name,  date of birth, surgical site, and procedure verified   Procedure prep:  Patient was prepped and draped in usual sterile fashion Prep type:  Isopropyl alcohol Anesthesia: the lesion was anesthetized in a standard fashion   Anesthetic:  1% lidocaine w/ epinephrine 1-100,000 buffered w/ 8.4% NaHCO3 Instrument used: DermaBlade   Hemostasis achieved with: pressure and aluminum chloride   Outcome: patient tolerated procedure well   Post-procedure details: sterile dressing applied and wound care instructions given   Dressing type: bandage and petrolatum    Specimen 1 - Surgical pathology Differential Diagnosis: D48.5 Nevus vs Dysplastic Nevus  Check Margins: No 10.0 x 8.0 mm pigmented patch  Dermatofibroma    Return in about 3 months (around 05/30/2023) for recheck dermatofibroma.  I, Ardis Rowan, RMA, am acting as scribe for Elie Goody, MD .   Documentation: I have reviewed the above documentation for accuracy and completeness, and I agree with the above.  Elie Goody, MD

## 2023-03-03 ENCOUNTER — Ambulatory Visit (HOSPITAL_BASED_OUTPATIENT_CLINIC_OR_DEPARTMENT_OTHER): Payer: BC Managed Care – PPO | Admitting: Family Medicine

## 2023-03-05 LAB — SURGICAL PATHOLOGY

## 2023-03-06 ENCOUNTER — Encounter: Payer: Self-pay | Admitting: Dermatology

## 2023-03-06 DIAGNOSIS — D239 Other benign neoplasm of skin, unspecified: Secondary | ICD-10-CM | POA: Insufficient documentation

## 2023-03-20 ENCOUNTER — Encounter (HOSPITAL_BASED_OUTPATIENT_CLINIC_OR_DEPARTMENT_OTHER): Payer: Self-pay | Admitting: Family Medicine

## 2023-04-14 ENCOUNTER — Other Ambulatory Visit (HOSPITAL_BASED_OUTPATIENT_CLINIC_OR_DEPARTMENT_OTHER): Payer: Self-pay

## 2023-04-14 ENCOUNTER — Ambulatory Visit (HOSPITAL_BASED_OUTPATIENT_CLINIC_OR_DEPARTMENT_OTHER): Payer: 59 | Admitting: Family Medicine

## 2023-04-14 ENCOUNTER — Telehealth (HOSPITAL_BASED_OUTPATIENT_CLINIC_OR_DEPARTMENT_OTHER): Payer: Self-pay

## 2023-04-14 DIAGNOSIS — R109 Unspecified abdominal pain: Secondary | ICD-10-CM

## 2023-04-14 DIAGNOSIS — R195 Other fecal abnormalities: Secondary | ICD-10-CM

## 2023-04-14 NOTE — Telephone Encounter (Signed)
Spoke with patient, he is only wanting a colonoscopy ordered states he does not necessarily need an appointment. Referral placed, appointment cancelled.

## 2023-04-21 ENCOUNTER — Encounter: Payer: Self-pay | Admitting: Gastroenterology

## 2023-05-07 ENCOUNTER — Ambulatory Visit: Payer: 59 | Admitting: Dermatology

## 2023-05-07 ENCOUNTER — Encounter: Payer: Self-pay | Admitting: Dermatology

## 2023-05-07 DIAGNOSIS — L918 Other hypertrophic disorders of the skin: Secondary | ICD-10-CM | POA: Diagnosis not present

## 2023-05-07 DIAGNOSIS — L720 Epidermal cyst: Secondary | ICD-10-CM | POA: Diagnosis not present

## 2023-05-07 DIAGNOSIS — Z86018 Personal history of other benign neoplasm: Secondary | ICD-10-CM | POA: Diagnosis not present

## 2023-05-07 NOTE — Patient Instructions (Signed)

## 2023-05-07 NOTE — Progress Notes (Signed)
   Follow-Up Visit   Subjective  Jack Shea is a 48 y.o. male who presents for the following: resolving abscess at left neck. Patient went to Urgent Care 12/26 and was given a weeks worth of doxycycline which did not seem to help much. It has drained but is still there, improved. Spot has been there for about 10 years or more.  The patient has spots, moles and lesions to be evaluated, some may be new or changing and the patient may have concern these could be cancer.   The following portions of the chart were reviewed this encounter and updated as appropriate: medications, allergies, medical history  Review of Systems:  No other skin or systemic complaints except as noted in HPI or Assessment and Plan.  Objective  Well appearing patient in no apparent distress; mood and affect are within normal limits.    A focused examination was performed of the following areas: Neck, back  Relevant exam findings are noted in the Assessment and Plan.  Left Posterior Neck Subcutaneous nodule with overlying slight erythema  Assessment & Plan   History of Dysplastic Nevi - No evidence of recurrence today at right low back - Recommend regular full body skin exams - Recommend daily broad spectrum sunscreen SPF 30+ to sun-exposed areas, reapply every 2 hours as needed.  - Call if any new or changing lesions are noted between office visits  Acrochordons (Skin Tags) - Fleshy, skin-colored pedunculated papule at anterior neck - Benign appearing.  - Observe. - If desired, they can be removed with an in office procedure that is not covered by insurance. - Please call the clinic if you notice any new or changing lesions. -Destruction Procedure Note Destruction method: cryotherapy   Informed consent: discussed and consent obtained   Lesion destroyed using liquid nitrogen: Yes   Outcome: patient tolerated procedure well with no complications   Post-procedure details: wound care instructions given    Locations: anterior neck # of Lesions Treated: 1  No Charge  Prior to procedure, discussed risks of blister formation, small wound, skin dyspigmentation, or rare scar following cryotherapy. Recommend Vaseline ointment to treated areas while healing.    EPIDERMAL INCLUSION CYST Left Posterior Neck Benign-appearing. Exam most consistent with an epidermal inclusion cyst. Discussed that a cyst is a benign growth that can grow over time and sometimes get irritated or inflamed. Recommend observation if it is not bothersome. Discussed option of surgical excision to remove it if it is growing, symptomatic, or other changes noted. Please call for new or changing lesions so they can be evaluated. - patient will consider excision in the future   [place]    No follow-ups on file.  Jack Shea, RMA, am acting as scribe for Boneta Sharps, MD .   Documentation: I have reviewed the above documentation for accuracy and completeness, and I agree with the above.  Boneta Sharps, MD

## 2023-05-21 ENCOUNTER — Encounter: Payer: Self-pay | Admitting: Gastroenterology

## 2023-05-21 ENCOUNTER — Ambulatory Visit: Payer: 59 | Admitting: Gastroenterology

## 2023-05-21 ENCOUNTER — Other Ambulatory Visit (INDEPENDENT_AMBULATORY_CARE_PROVIDER_SITE_OTHER): Payer: 59

## 2023-05-21 VITALS — BP 130/80 | HR 73 | Ht 75.0 in | Wt 196.0 lb

## 2023-05-21 DIAGNOSIS — R194 Change in bowel habit: Secondary | ICD-10-CM

## 2023-05-21 DIAGNOSIS — K5900 Constipation, unspecified: Secondary | ICD-10-CM | POA: Diagnosis not present

## 2023-05-21 DIAGNOSIS — K219 Gastro-esophageal reflux disease without esophagitis: Secondary | ICD-10-CM

## 2023-05-21 DIAGNOSIS — Z1211 Encounter for screening for malignant neoplasm of colon: Secondary | ICD-10-CM

## 2023-05-21 DIAGNOSIS — F1090 Alcohol use, unspecified, uncomplicated: Secondary | ICD-10-CM | POA: Diagnosis not present

## 2023-05-21 DIAGNOSIS — R140 Abdominal distension (gaseous): Secondary | ICD-10-CM

## 2023-05-21 LAB — COMPREHENSIVE METABOLIC PANEL
ALT: 55 U/L — ABNORMAL HIGH (ref 0–53)
AST: 32 U/L (ref 0–37)
Albumin: 5.2 g/dL (ref 3.5–5.2)
Alkaline Phosphatase: 52 U/L (ref 39–117)
BUN: 15 mg/dL (ref 6–23)
CO2: 29 meq/L (ref 19–32)
Calcium: 10.1 mg/dL (ref 8.4–10.5)
Chloride: 105 meq/L (ref 96–112)
Creatinine, Ser: 0.97 mg/dL (ref 0.40–1.50)
GFR: 92.5 mL/min (ref >60.00)
Glucose, Bld: 100 mg/dL — ABNORMAL HIGH (ref 70–99)
Potassium: 4.9 meq/L (ref 3.5–5.1)
Sodium: 141 meq/L (ref 135–145)
Total Bilirubin: 0.6 mg/dL (ref 0.2–1.2)
Total Protein: 8.2 g/dL (ref 6.0–8.3)

## 2023-05-21 LAB — CBC WITH DIFFERENTIAL/PLATELET
Basophils Absolute: 0 10*3/uL (ref 0.0–0.1)
Basophils Relative: 0.7 % (ref 0.0–3.0)
Eosinophils Absolute: 0.1 10*3/uL (ref 0.0–0.7)
Eosinophils Relative: 2.1 % (ref 0.0–5.0)
HCT: 46.9 % (ref 39.0–52.0)
Hemoglobin: 15.6 g/dL (ref 13.0–17.0)
Lymphocytes Relative: 30.6 % (ref 12.0–46.0)
Lymphs Abs: 1.7 10*3/uL (ref 0.7–4.0)
MCHC: 33.3 g/dL (ref 30.0–36.0)
MCV: 90.1 fL (ref 78.0–100.0)
Monocytes Absolute: 0.5 10*3/uL (ref 0.1–1.0)
Monocytes Relative: 9 % (ref 3.0–12.0)
Neutro Abs: 3.2 10*3/uL (ref 1.4–7.7)
Neutrophils Relative %: 57.6 % (ref 43.0–77.0)
Platelets: 242 10*3/uL (ref 150.0–400.0)
RBC: 5.21 Mil/uL (ref 4.22–5.81)
RDW: 13.2 % (ref 11.5–15.5)
WBC: 5.5 10*3/uL (ref 4.0–10.5)

## 2023-05-21 LAB — TSH: TSH: 2.22 u[IU]/mL (ref 0.35–5.50)

## 2023-05-21 MED ORDER — SUFLAVE 178.7 G PO SOLR: 1.0000 | Freq: Once | ORAL | 0 refills | Status: AC

## 2023-05-21 NOTE — Progress Notes (Signed)
HPI :  48 year old male with a history of GERD, DVT, asthma, here to establish care for altered bowel habits, GERD, colon cancer screening.  He has a new patient, referred by Raymond de Peru MD.  He states he typically has normal bowel habits.  This past summer 2024 he had some worsening constipation and bloating that was bothering him.  He had endorsed significant work travel, difficulty getting a regular schedule.  He had been drinking a few alcoholic beverages with dinner each night as part of work dinners, he felt this was bothering him.  He stopped/minimize his alcohol use in November and states since doing so he is felt a lot better.  He also has started taking a probiotic.  His bowel habits have returned to previous baseline, going 1-2 times per day.  He still has increased gas after eating certain foods and feels bloated at times.  He denies any blood in his stools.  He has never had a colonoscopy.  No family history of colon cancer.  He inquires about reflux as well.  He has had intermittent pyrosis longstanding.  He has used Prilosec OTC for some time, it works well to control his symptoms when he takes it, he will take it for roughly 1 week at a time and then off for several weeks.  He denies any dysphagia.  No nausea or vomiting.  Weight generally stable, no unintentional weight loss but thinks he has lost a few pounds since stopping the whole.  He is eating well.  He exercises routinely.  I reviewed his history with him, he did have a history of DVT after tearing his meniscus.  He states he was only on short-term anticoagulation has not needed long-term anticoagulation.  He is from [place] originally, travels frequently for work.  Denies any family history of celiac disease.  He has not had any basic labs in our system ever that I can see, no baseline CBC or c-Met etc.  He denies any problems with anesthesia in the past.  Past Medical History:  Diagnosis Date   Acute medial meniscus  tear of right knee 01/25/2021   Asthma    DVT (deep venous thrombosis) (HCC)    Dysplastic nevus    R low back     Past Surgical History:  Procedure Laterality Date   Facial reconstruction due to orbital and maxilla fracture      Family History  Problem Relation Age of Onset   Parkinson's disease Mother    Stroke Father    Early death Father    Esophageal cancer Neg Hx    Liver disease Neg Hx    Colon cancer Neg Hx    Social History   Tobacco Use   Smoking status: Never   Smokeless tobacco: Never  Vaping Use   Vaping status: Never Used  Substance Use Topics   Alcohol use: Yes    Alcohol/week: 8.0 standard drinks of alcohol    Types: 3 Glasses of wine, 5 Cans of beer per week    Comment: 6-7 drinks per week   Drug use: No   No current outpatient medications on file.   No current facility-administered medications for this visit.   No Known Allergies   Review of Systems: All systems reviewed and negative except where noted in HPI.   Physical Exam: BP 130/80   Pulse 73   Ht 6\' 3"  (1.905 m)   Wt 196 lb (88.9 kg)   BMI 24.50 kg/m  Constitutional:  Pleasant,well-developed, male in no acute distress. HEENT: Normocephalic and atraumatic. Conjunctivae are normal. No scleral icterus. Neck supple.  Cardiovascular: Normal rate, regular rhythm.  Pulmonary/chest: Effort normal and breath sounds normal. No wheezing, rales or rhonchi. Abdominal: Soft, nondistended, nontender. There are no masses palpable. No hepatomegaly. Extremities: no edema Lymphadenopathy: No cervical adenopathy noted. Neurological: Alert and oriented to person place and time. Skin: Skin is warm and dry. No rashes noted. Psychiatric: Normal mood and affect. Behavior is normal.   ASSESSMENT: 48 y.o. male here for assessment of the following  1. Altered bowel habits   2. Gastroesophageal reflux disease, unspecified whether esophagitis present   3. Colon cancer screening    As above, several  months worth of intermittent constipation, worsening bloating.  He feels this is due to his travel schedule and lack of consistent diet, as well as increased alcohol use.  He has really cut back on his alcohol use and feels this has made a big difference and waiting encouraged him to continue this for now.  He has never had a colonoscopy, we discussed colon cancer screening options and recommended optical colonoscopy if he can tolerate it.  We discussed risks and benefits of the procedure and anesthesia he wants to proceed.  In the interim he will continue to minimize his alcohol use as he feels that has helped him.  I think okay to stop the probiotic and see how he does off that.  That being said if he finds it helpful we can continue it.  We discussed reflux.  He has had this for at least 7 to 8 years or so from his report.  PPI control symptoms with intermittent use.  No alarm symptoms such as dysphagia etc.  Given his ethnicity, gender, duration of symptoms I do think he will meet criteria for screening endoscopy for Barrett's.  He is not yet 49 but in discussion of this he was hoping to do this at the same time as his colonoscopy.  I offered him the endoscopy to be done at the same time as colonoscopy in this light, he would be reassured to do the endoscopy and hopefully no Barrett's.  He will continue intermittent Prilosec OTC in the interim.  Otherwise I can see that he is ever had baseline labs, offered him that to ensure no anemia etc.  He wishes to proceed.  Will also check thyroid in light of his bowel symptoms and screening for celiac disease in light of his intermittent bloating.  He agrees.   PLAN: - schedule for EGD and colonoscopy - screen for BE, CRC - basic labs - CBC, CMET, TSH, celiac labs  - continue to minimize alcohol use - continue omeprazole PRN  Harlin Rain, MD [facility]  CC: de [place], Buren Kos, MD

## 2023-05-21 NOTE — Patient Instructions (Signed)
You have been scheduled for an endoscopy and colonoscopy. Please follow the written instructions given to you at your visit today.  Please pick up your prep supplies at the pharmacy within the next 1-3 days.  If you use inhalers (even only as needed), please bring them with you on the day of your procedure.  DO NOT TAKE 7 DAYS PRIOR TO TEST- Trulicity (dulaglutide) Ozempic, Wegovy (semaglutide) Mounjaro (tirzepatide) Bydureon Bcise (exanatide extended release)  DO NOT TAKE 1 DAY PRIOR TO YOUR TEST Rybelsus (semaglutide) Adlyxin (lixisenatide) Victoza (liraglutide) Byetta (exanatide) ___________________________________________________________________________  Jack Shea will receive your bowel preparation through [facility], which ensures the lowest copay and home delivery, with outreach via text or call from an [phone] number. Please respond promptly to avoid rescheduling. If you are interested in alternative options or have any questions please contact them at [phone]  Your Provider Has Sent Your Bowel Prep Regimen To [facility] What to expect. [facility] will contact you to verify your information and collect your copay, if applicable. Enjoy the comfort of your home while we deliver your prescription to you, free of any shipping charges. Fast, FREE delivery or shipping. [facility] accepts all major insurance benefits and applies discounts & coupons  Have additional questions? [facility]'s patient care team is always here to help.  Chat: [email or web address] Call: [phone] Email: [email or web address] NCPDP: [id number] How will we contact you? Welcome Phone call  a Welcome text and a Checkout link in a text Texts you receive from [phone] Are Not Spam.   *To set up delivery, you must complete the checkout process via link or speak to one of our patient care representatives. If we are unable to reach you, your prescription may be delayed.   Continue  omeprazole.  Please go to the lab in the basement of our building to have lab work done as you leave today. Hit "B" for basement when you get on the elevator.  When the doors open the lab is on your left.  We will call you with the results. Thank you.

## 2023-05-22 ENCOUNTER — Ambulatory Visit: Payer: 59 | Admitting: Dermatology

## 2023-05-22 LAB — IGA: Immunoglobulin A: 149 mg/dL (ref 47–310)

## 2023-05-22 LAB — TISSUE TRANSGLUTAMINASE, IGA: (tTG) Ab, IgA: <1 U/mL

## 2023-05-23 ENCOUNTER — Encounter: Payer: Self-pay | Admitting: Gastroenterology

## 2023-06-16 ENCOUNTER — Telehealth: Payer: Self-pay

## 2023-06-16 DIAGNOSIS — R748 Abnormal levels of other serum enzymes: Secondary | ICD-10-CM

## 2023-06-16 NOTE — Telephone Encounter (Signed)
-----   Message from [facility] Marylu Lund H sent at 05/21/2023  4:50 PM EST ----- Regarding: LFTs Patient will be due for LFTS in mid Feb. Place order

## 2023-06-16 NOTE — Telephone Encounter (Signed)
 MyChart message to patient to go for LFTs one day this week

## 2023-06-18 ENCOUNTER — Encounter: Payer: Self-pay | Admitting: Gastroenterology

## 2023-06-18 ENCOUNTER — Ambulatory Visit: Payer: 59 | Admitting: Gastroenterology

## 2023-06-18 VITALS — BP 113/77 | HR 64 | Temp 97.7°F | Resp 17 | Ht 75.0 in | Wt 196.0 lb

## 2023-06-18 DIAGNOSIS — K449 Diaphragmatic hernia without obstruction or gangrene: Secondary | ICD-10-CM | POA: Diagnosis not present

## 2023-06-18 DIAGNOSIS — K5730 Diverticulosis of large intestine without perforation or abscess without bleeding: Secondary | ICD-10-CM | POA: Diagnosis not present

## 2023-06-18 DIAGNOSIS — D127 Benign neoplasm of rectosigmoid junction: Secondary | ICD-10-CM

## 2023-06-18 DIAGNOSIS — K648 Other hemorrhoids: Secondary | ICD-10-CM | POA: Diagnosis not present

## 2023-06-18 DIAGNOSIS — K2289 Other specified disease of esophagus: Secondary | ICD-10-CM | POA: Diagnosis not present

## 2023-06-18 DIAGNOSIS — K6289 Other specified diseases of anus and rectum: Secondary | ICD-10-CM | POA: Diagnosis not present

## 2023-06-18 DIAGNOSIS — K529 Noninfective gastroenteritis and colitis, unspecified: Secondary | ICD-10-CM

## 2023-06-18 DIAGNOSIS — K219 Gastro-esophageal reflux disease without esophagitis: Secondary | ICD-10-CM | POA: Diagnosis not present

## 2023-06-18 DIAGNOSIS — K6389 Other specified diseases of intestine: Secondary | ICD-10-CM | POA: Diagnosis not present

## 2023-06-18 DIAGNOSIS — K209 Esophagitis, unspecified without bleeding: Secondary | ICD-10-CM | POA: Diagnosis not present

## 2023-06-18 DIAGNOSIS — Z1211 Encounter for screening for malignant neoplasm of colon: Secondary | ICD-10-CM

## 2023-06-18 MED ORDER — SODIUM CHLORIDE 0.9 % IV SOLN: 500.0000 mL | INTRAVENOUS | Status: AC

## 2023-06-18 NOTE — Progress Notes (Signed)
 Pt's states no medical or surgical changes since previsit or office visit.

## 2023-06-18 NOTE — Op Note (Signed)
 [facility] Patient Name: Jack Shea Procedure Date: 06/18/2023 7:32 AM MRN: [id number] Endoscopist: Viviann Spare P. Adela Lank , MD, [id number] Age: 48 Referring MD:  Date of Birth: [birth year] Gender: Male Account #: [id number] Procedure:                Upper GI endoscopy Indications:              Screening for Barrett's esophagus, history of                            gastro-esophageal reflux disease Medicines:                Monitored Anesthesia Care Procedure:                Pre-Anesthesia Assessment:                           - Prior to the procedure, a History and Physical                            was performed, and patient medications and                            allergies were reviewed. The patient's tolerance of                            previous anesthesia was also reviewed. The risks                            and benefits of the procedure and the sedation                            options and risks were discussed with the patient.                            All questions were answered, and informed consent                            was obtained. Prior Anticoagulants: The patient has                            taken no anticoagulant or antiplatelet agents. ASA                            Grade Assessment: III - A patient with severe                            systemic disease. After reviewing the risks and                            benefits, the patient was deemed in satisfactory                            condition to undergo the procedure.  After obtaining informed consent, the endoscope was                            passed under direct vision. Throughout the                            procedure, the patient's blood pressure, pulse, and                            oxygen saturations were monitored continuously. The                            Olympus Scope [id number] was introduced through the                            mouth, and  advanced to the second part of duodenum.                            The upper GI endoscopy was accomplished without                            difficulty. The patient tolerated the procedure                            well. Scope In: Scope Out: Findings:                 Esophagogastric landmarks were identified: the                            Z-line was found at 40 cm, the gastroesophageal                            junction was found at 40 cm and the upper extent of                            the gastric folds was found at 41 cm from the                            incisors.                           A 1 cm hiatal hernia was present.                           A single diminutive nodule was found at the                            gastroesophageal junction. I suspect inflammatory                            in etiology. Biopsies were taken with a cold                            forceps for  histology which seemingly removed it.                           A single whitish appearing small plaque was found                            just proximal to gastroesophageal junction.                            Biopsies were taken with a cold forceps for                            histology.                           The exam of the esophagus was otherwise normal.                           The entire examined stomach was normal.                           The examined duodenum was normal. Complications:            No immediate complications. Estimated blood loss:                            Minimal. Estimated Blood Loss:     Estimated blood loss was minimal. Impression:               - Esophagogastric landmarks identified.                           - 1 cm hiatal hernia.                           - Dimunitve nodule found in the esophagus. Suspect                            inflammatory but biopsied / removed.                           - A single plaque at the gastroesophageal junction.                             Biopsied.                           - Normal esophagus otherwise.                           - Normal stomach.                           - Normal examined duodenum. Recommendation:           - Patient has a contact number available for  emergencies. The signs and symptoms of potential                            delayed complications were discussed with the                            patient. Return to normal activities tomorrow.                            Written discharge instructions were provided to the                            patient.                           - Resume previous diet.                           - Continue present medications.                           - Await pathology results. Viviann Spare P. Que Meneely, MD 06/18/2023 8:14:27 AM This report has been signed electronically.

## 2023-06-18 NOTE — Progress Notes (Signed)
 To pacu, VSS. Report to [email or web address]

## 2023-06-18 NOTE — Progress Notes (Signed)
 History and Physical Interval Note: Patient seen 05/21/23 - no interval changes. History of longstanding GERD on omeprazole, EGD to further evaluate, screen for BE. He is also here for first time colonoscopy / screening.  He feels well without complaints today otherwise - wishes to proceed as scheduled.   06/18/2023 7:28 AM  Jack Shea  has presented today for endoscopic procedure(s), with the diagnosis of  Encounter Diagnoses  Name Primary?   Colon cancer screening Yes   Gastroesophageal reflux disease, unspecified whether esophagitis present   .  The various methods of evaluation and treatment have been discussed with the patient and/or family. After consideration of risks, benefits and other options for treatment, the patient has consented to  the endoscopic procedure(s).   The patient's history has been reviewed, patient examined, no change in status, stable for surgery.  I have reviewed the patient's chart and labs.  Questions were answered to the patient's satisfaction.    Harlin Rain, MD [facility]

## 2023-06-18 NOTE — Progress Notes (Signed)
 Called to room to assist during endoscopic procedure.  Patient ID and intended procedure confirmed with present staff. Received instructions for my participation in the procedure from the performing physician.

## 2023-06-18 NOTE — Patient Instructions (Addendum)
 YOU HAD AN ENDOSCOPIC PROCEDURE TODAY AT THE [facility]:   Refer to the procedure report that was given to you for any specific questions about what was found during the examination.  If the procedure report does not answer your questions, please call your gastroenterologist to clarify.  If you requested that your care partner not be given the details of your procedure findings, then the procedure report has been included in a sealed envelope for you to review at your convenience later.  YOU SHOULD EXPECT: Some feelings of bloating in the abdomen. Passage of more gas than usual.  Walking can help get rid of the air that was put into your GI tract during the procedure and reduce the bloating. If you had a lower endoscopy (such as a colonoscopy or flexible sigmoidoscopy) you may notice spotting of blood in your stool or on the toilet paper. If you underwent a bowel prep for your procedure, you may not have a normal bowel movement for a few days.  Please Note:  You might notice some irritation and congestion in your nose or some drainage.  This is from the oxygen used during your procedure.  There is no need for concern and it should clear up in a day or so.  SYMPTOMS TO REPORT IMMEDIATELY:  Following lower endoscopy (colonoscopy or flexible sigmoidoscopy):  Excessive amounts of blood in the stool  Significant tenderness or worsening of abdominal pains  Swelling of the abdomen that is new, acute  Fever of 100F or higher  Following upper endoscopy (EGD)  Vomiting of blood or coffee ground material  New chest pain or pain under the shoulder blades  Painful or persistently difficult swallowing  New shortness of breath  Fever of 100F or higher  Black, tarry-looking stools   For urgent or emergent issues, a gastroenterologist can be reached at any hour by calling [phone]. Do not use MyChart messaging for urgent concerns.    DIET:  We do recommend a small meal at first, but  then you may proceed to your regular diet.  Drink plenty of fluids but you should avoid alcoholic beverages for 24 hours.  MEDICATIONS: Continue present medications  Please see handouts given to you by your recovery nurse: Polyps, Diverticulosis, Hemorrhoids, Hiatal Hernia.  FOLLOW UP: Await pathology results.  Thank you for allowing [place] to provide for your healthcare needs today.  ACTIVITY:  You should plan to take it easy for the rest of today and you should NOT DRIVE or use heavy machinery until tomorrow (because of the sedation medicines used during the test).    FOLLOW UP: Our staff will call the number listed on your records the next business day following your procedure.  We will call around 7:15- 8:00 am to check on you and address any questions or concerns that you may have regarding the information given to you following your procedure. If we do not reach you, we will leave a message.     If any biopsies were taken you will be contacted by phone or by letter within the next 1-3 weeks.  Please call us at [phone] if you have not heard about the biopsies in 3 weeks.    SIGNATURES/CONFIDENTIALITY: You and/or your care partner have signed paperwork which will be entered into your electronic medical record.  These signatures attest to the fact that that the information above on your After Visit Summary has been reviewed and is understood.  Full responsibility of the confidentiality  of this discharge information lies with you and/or your care-partner.

## 2023-06-18 NOTE — Op Note (Signed)
 [facility] Patient Name: Jack Shea Procedure Date: 06/18/2023 7:31 AM MRN: [id number] Endoscopist: Viviann Spare P. Adela Lank , MD, [id number] Age: 48 Referring MD:  Date of Birth: April 19, 1976 Gender: Male Account #: [id number] Procedure:                Colonoscopy Indications:              Screening for colorectal malignant neoplasm, This                            is the patient's first colonoscopy Medicines:                Monitored Anesthesia Care Procedure:                Pre-Anesthesia Assessment:                           - Prior to the procedure, a History and Physical                            was performed, and patient medications and                            allergies were reviewed. The patient's tolerance of                            previous anesthesia was also reviewed. The risks                            and benefits of the procedure and the sedation                            options and risks were discussed with the patient.                            All questions were answered, and informed consent                            was obtained. Prior Anticoagulants: The patient has                            taken no anticoagulant or antiplatelet agents. ASA                            Grade Assessment: II - A patient with mild systemic                            disease. After reviewing the risks and benefits,                            the patient was deemed in satisfactory condition to                            undergo the procedure.  After obtaining informed consent, the colonoscope                            was passed under direct vision. Throughout the                            procedure, the patient's blood pressure, pulse, and                            oxygen saturations were monitored continuously. The                            CF [id number] #[id number] was introduced through the anus                            and advanced to  the the terminal ileum, with                            identification of the appendiceal orifice and IC                            valve. The colonoscopy was performed without                            difficulty. The patient tolerated the procedure                            well. The quality of the bowel preparation was                            good. The terminal ileum, ileocecal valve,                            appendiceal orifice, and rectum were photographed. Scope In: 7:49:17 AM Scope Out: 8:05:05 AM Scope Withdrawal Time: 0 hours 12 minutes 44 seconds  Total Procedure Duration: 0 hours 15 minutes 48 seconds  Findings:                 The perianal and digital rectal examinations were                            normal.                           The terminal ileum appeared normal.                           Localized mild focal area of inflammation                            characterized by erosions and erythema was found in                            the distal transverse colon. Biopsies were taken  with a cold forceps for histology.                           Multiple diverticula were found in the entire colon.                           A diminutive polyp was found in the recto-sigmoid                            colon. The polyp was sessile. The polyp was removed                            with a cold biopsy forceps. Resection and retrieval                            were complete.                           Anal papilla(e) were hypertrophied.                           Internal hemorrhoids were found during retroflexion.                           The exam was otherwise without abnormality. Complications:            No immediate complications. Estimated blood loss:                            Minimal. Estimated Blood Loss:     Estimated blood loss was minimal. Impression:               - The examined portion of the ileum was normal.                            - Localized mild focal change inflammation was                            found in the distal transverse colon. Biopsied.                           - Diverticulosis in the entire examined colon.                           - One diminutive polyp at the recto-sigmoid colon,                            removed with a cold biopsy forceps. Resected and                            retrieved.                           - Anal papilla(e) were hypertrophied.                           -  Internal hemorrhoids.                           - The examination was otherwise normal. Recommendation:           - Patient has a contact number available for                            emergencies. The signs and symptoms of potential                            delayed complications were discussed with the                            patient. Return to normal activities tomorrow.                            Written discharge instructions were provided to the                            patient.                           - Resume previous diet.                           - Continue present medications.                           - Await pathology results. Viviann Spare P. Wilburt Messina, MD 06/18/2023 8:12:21 AM This report has been signed electronically.

## 2023-06-19 ENCOUNTER — Telehealth: Payer: Self-pay | Admitting: *Deleted

## 2023-06-19 NOTE — Telephone Encounter (Signed)
  Follow up Call-     06/18/2023    7:14 AM  Call back number  Post procedure Call Back phone  # [phone]  Permission to leave phone message Yes     Patient questions:  Do you have a fever, pain , or abdominal swelling? No. Pain Score  0 *  Have you tolerated food without any problems? Yes.    Have you been able to return to your normal activities? Yes.    Do you have any questions about your discharge instructions: Diet   No. Medications  No. Follow up visit  No.  Do you have questions or concerns about your Care? No.  Actions: * If pain score is 4 or above: No action needed, pain <4.

## 2023-06-24 LAB — SURGICAL PATHOLOGY

## 2023-06-26 ENCOUNTER — Encounter: Payer: Self-pay | Admitting: Gastroenterology

## 2023-09-11 ENCOUNTER — Encounter (HOSPITAL_BASED_OUTPATIENT_CLINIC_OR_DEPARTMENT_OTHER): Payer: Self-pay | Admitting: *Deleted

## 2024-02-05 ENCOUNTER — Ambulatory Visit: Payer: 59 | Admitting: Dermatology

## 2024-02-05 ENCOUNTER — Encounter: Payer: Self-pay | Admitting: Dermatology

## 2024-02-05 DIAGNOSIS — L729 Follicular cyst of the skin and subcutaneous tissue, unspecified: Secondary | ICD-10-CM

## 2024-02-05 DIAGNOSIS — L309 Dermatitis, unspecified: Secondary | ICD-10-CM

## 2024-02-05 DIAGNOSIS — D229 Melanocytic nevi, unspecified: Secondary | ICD-10-CM

## 2024-02-05 DIAGNOSIS — D235 Other benign neoplasm of skin of trunk: Secondary | ICD-10-CM | POA: Diagnosis not present

## 2024-02-05 DIAGNOSIS — L821 Other seborrheic keratosis: Secondary | ICD-10-CM

## 2024-02-05 DIAGNOSIS — L989 Disorder of the skin and subcutaneous tissue, unspecified: Secondary | ICD-10-CM

## 2024-02-05 DIAGNOSIS — D492 Neoplasm of unspecified behavior of bone, soft tissue, and skin: Secondary | ICD-10-CM | POA: Diagnosis not present

## 2024-02-05 DIAGNOSIS — W908XXA Exposure to other nonionizing radiation, initial encounter: Secondary | ICD-10-CM

## 2024-02-05 DIAGNOSIS — D239 Other benign neoplasm of skin, unspecified: Secondary | ICD-10-CM

## 2024-02-05 DIAGNOSIS — L578 Other skin changes due to chronic exposure to nonionizing radiation: Secondary | ICD-10-CM | POA: Diagnosis not present

## 2024-02-05 DIAGNOSIS — L814 Other melanin hyperpigmentation: Secondary | ICD-10-CM

## 2024-02-05 DIAGNOSIS — Z1283 Encounter for screening for malignant neoplasm of skin: Secondary | ICD-10-CM

## 2024-02-05 DIAGNOSIS — L308 Other specified dermatitis: Secondary | ICD-10-CM

## 2024-02-05 DIAGNOSIS — D1801 Hemangioma of skin and subcutaneous tissue: Secondary | ICD-10-CM

## 2024-02-05 DIAGNOSIS — B360 Pityriasis versicolor: Secondary | ICD-10-CM

## 2024-02-05 HISTORY — DX: Other benign neoplasm of skin, unspecified: D23.9

## 2024-02-05 MED ORDER — KETOCONAZOLE 2 % EX SHAM: MEDICATED_SHAMPOO | CUTANEOUS | 11 refills | Status: AC

## 2024-02-05 MED ORDER — CLOBETASOL PROPIONATE 0.05 % EX CREA: TOPICAL_CREAM | CUTANEOUS | 2 refills | Status: AC

## 2024-02-05 NOTE — Progress Notes (Signed)
 Follow-Up Visit   Subjective  Jack Shea is a 48 y.o. male who presents for the following: Skin Cancer Screening and Full Body Skin Exam. Hx of dysplastic nevus.   Eczema flaring on hands. No Rx treatment.   The patient presents for Total-Body Skin Exam (TBSE) for skin cancer screening and mole check. The patient has spots, moles and lesions to be evaluated, some may be new or changing and the patient may have concern these could be cancer.    The following portions of the chart were reviewed this encounter and updated as appropriate: medications, allergies, medical history  Review of Systems:  No other skin or systemic complaints except as noted in HPI or Assessment and Plan.  Objective  Well appearing patient in no apparent distress; mood and affect are within normal limits.  A full examination was performed including scalp, head, eyes, ears, nose, lips, neck, chest, axillae, abdomen, back, buttocks, bilateral upper extremities, bilateral lower extremities, hands, feet, fingers, toes, fingernails, and toenails. All findings within normal limits unless otherwise noted below.   Relevant physical exam findings are noted in the Assessment and Plan.  central upper back 9 mm pink patch with 4 mm pink inflamed papule  Right scapular back 5 mm irregular pink to brown macule   Assessment & Plan   SKIN CANCER SCREENING PERFORMED TODAY.  HISTORY OF DYSPLASTIC NEVUS No evidence of recurrence today Recommend regular full body skin exams Recommend daily broad spectrum sunscreen SPF 30+ to sun-exposed areas, reapply every 2 hours as needed.  Call if any new or changing lesions are noted between office visits   ACTINIC DAMAGE - Chronic condition, secondary to cumulative UV/sun exposure - diffuse scaly erythematous macules with underlying dyspigmentation - Recommend daily broad spectrum sunscreen SPF 30+ to sun-exposed areas, reapply every 2 hours as needed.  - Staying in the  shade or wearing long sleeves, sun glasses (UVA+UVB protection) and wide brim hats (4-inch brim around the entire circumference of the hat) are also recommended for sun protection.  - Call for new or changing lesions.  LENTIGINES, SEBORRHEIC KERATOSES, HEMANGIOMAS - Benign normal skin lesions - Benign-appearing - Call for any changes  MELANOCYTIC NEVI - Tan-brown and/or pink-flesh-colored symmetric macules and papules - Benign appearing on exam today - Observation - Call clinic for new or changing moles - Recommend daily use of broad spectrum spf 30+ sunscreen to sun-exposed areas.   HAND DERMATITIS Exam Scaly pink plaque at R 5th dorsal finger  Chronic and persistent condition with duration or expected duration over one year. Condition is bothersome/symptomatic for patient. Currently flared.   Hand Dermatitis is a chronic type of eczema that can come and go on the hands and fingers.  While there is no cure, the rash and symptoms can be managed with topical prescription medications, and for more severe cases, with systemic medications.  Recommend mild soap and routine use of moisturizing cream after handwashing.  Minimize soap/water exposure when possible.    Treatment Plan Apply clobetasol 0.05% cream twice a day as needed to affected areas on hands until smooth. Avoid applying to face, groin, and axilla. Use as directed. Long-term use can cause thinning of the skin.  Topical steroids (such as triamcinolone , fluocinolone, fluocinonide, mometasone, clobetasol, halobetasol, betamethasone, hydrocortisone) can cause thinning and lightening of the skin if they are used for too long in the same area. Your physician has selected the right strength medicine for your problem and area affected on the body. Please use your  medication only as directed by your physician to prevent side effects.    Recommend mild soap and moisturizing cream with hand washing.   SEBORRHEIC KERATOSIS vs VV -  Stuck-on, waxy, tan-brown plaque at medial right upper thigh - Benign-appearing - Discussed benign etiology and prognosis. - Observe - Call for any changes   Tinea Versicolor  Chronic and persistent condition with duration or expected duration over one year. Condition is bothersome/symptomatic for patient. Currently flared.   Advised it can take many months for affected areas to repigment.  Start ketoconazole 2% shampoo  lather on body, leave on 3 minutes then rinse off. Use daily until improved then once a week for maintenance.    Tinea versicolor is a chronic recurrent skin rash causing discolored scaly spots most commonly seen on back, chest, and/or shoulders.  It is generally asymptomatic. The rash is due to overgrowth of a common type of yeast present on everyone's skin and it is not contagious.  It tends to flare more in the summer due to increased sweating on trunk.  After rash is treated, the scaliness will resolve, but the discoloration will take longer to return to normal pigmentation. The periodic use of an OTC medicated soap/shampoo with zinc or selenium sulfide can be helpful to prevent yeast overgrowth and recurrence.     NEOPLASM OF SKIN (2) central upper back Skin / nail biopsy Type of biopsy: tangential   Informed consent: discussed and consent obtained   Timeout: patient name, date of birth, surgical site, and procedure verified   Procedure prep:  Patient was prepped and draped in usual sterile fashion Prep type:  Isopropyl alcohol Anesthesia: the lesion was anesthetized in a standard fashion   Anesthetic:  1% lidocaine  w/ epinephrine 1-100,000 buffered w/ 8.4% NaHCO3 Instrument used: DermaBlade   Hemostasis achieved with: pressure and aluminum chloride   Outcome: patient tolerated procedure well   Post-procedure details: sterile dressing applied and wound care instructions given   Dressing type: bandage and petrolatum    Specimen 1 - Surgical  pathology Differential Diagnosis: SCC vs inflamed EIC  Check Margins: No Right scapular back Skin / nail biopsy Type of biopsy: tangential   Informed consent: discussed and consent obtained   Timeout: patient name, date of birth, surgical site, and procedure verified   Procedure prep:  Patient was prepped and draped in usual sterile fashion Prep type:  Isopropyl alcohol Anesthesia: the lesion was anesthetized in a standard fashion   Anesthetic:  1% lidocaine  w/ epinephrine 1-100,000 buffered w/ 8.4% NaHCO3 Instrument used: DermaBlade   Hemostasis achieved with: pressure and aluminum chloride   Outcome: patient tolerated procedure well   Post-procedure details: sterile dressing applied and wound care instructions given   Dressing type: bandage and petrolatum    Specimen 2 - Surgical pathology Differential Diagnosis: dysplastic nevus vs melanoma  Check Margins: No MULTIPLE BENIGN NEVI   LENTIGINES   ACTINIC ELASTOSIS   SEBORRHEIC KERATOSES   CHERRY ANGIOMA   HAND DERMATITIS   TINEA VERSICOLOR   Return in about 1 year (around 02/04/2025) for TBSE, HxDN.  I, Jill Parcell, CMA, am acting as scribe for Boneta Sharps, MD.   Documentation: I have reviewed the above documentation for accuracy and completeness, and I agree with the above.  Boneta Sharps, MD

## 2024-02-05 NOTE — Patient Instructions (Addendum)
 Start ketoconazole 2% shampoo  lather on body, leave on 3 minutes then rinse off. Use daily until improved then once a week for maintenance.    Apply clobetasol 0.05% cream twice a day as needed to affected areas on hands until smooth. Avoid applying to face, groin, and axilla. Use as directed. Long-term use can cause thinning of the skin.  Topical steroids (such as triamcinolone , fluocinolone, fluocinonide, mometasone, clobetasol, halobetasol, betamethasone, hydrocortisone) can cause thinning and lightening of the skin if they are used for too long in the same area. Your physician has selected the right strength medicine for your problem and area affected on the body. Please use your medication only as directed by your physician to prevent side effects.      Wound Care Instructions  Cleanse wound gently with soap and water once a day then pat dry with clean gauze. Apply a thin coat of Petrolatum (petroleum jelly, Vaseline) over the wound (unless you have an allergy to this). We recommend that you use a new, sterile tube of Vaseline. Do not pick or remove scabs. Do not remove the yellow or white healing tissue from the base of the wound.  Cover the wound with fresh, clean, nonstick gauze and secure with paper tape. You may use Band-Aids in place of gauze and tape if the wound is small enough, but would recommend trimming much of the tape off as there is often too much. Sometimes Band-Aids can irritate the skin.  You should call the office for your biopsy report after 1 week if you have not already been contacted.  If you experience any problems, such as abnormal amounts of bleeding, swelling, significant bruising, significant pain, or evidence of infection, please call the office immediately.  FOR ADULT SURGERY PATIENTS: If you need something for pain relief you may take 1 extra strength Tylenol  (acetaminophen ) AND 2 Ibuprofen (200mg  each) together every 4 hours as needed for pain. (do not  take these if you are allergic to them or if you have a reason you should not take them.) Typically, you may only need pain medication for 1 to 3 days.       Recommend daily broad spectrum sunscreen SPF 30+ to sun-exposed areas, reapply every 2 hours as needed. Call for new or changing lesions.  Staying in the shade or wearing long sleeves, sun glasses (UVA+UVB protection) and wide brim hats (4-inch brim around the entire circumference of the hat) are also recommended for sun protection.      Melanoma ABCDEs  Melanoma is the most dangerous type of skin cancer, and is the leading cause of death from skin disease.  You are more likely to develop melanoma if you: Have light-colored skin, light-colored eyes, or red or blond hair Spend a lot of time in the sun Tan regularly, either outdoors or in a tanning bed Have had blistering sunburns, especially during childhood Have a close family member who has had a melanoma Have atypical moles or large birthmarks  Early detection of melanoma is key since treatment is typically straightforward and cure rates are extremely high if we catch it early.   The first sign of melanoma is often a change in a mole or a new dark spot.  The ABCDE system is a way of remembering the signs of melanoma.  A for asymmetry:  The two halves do not match. B for border:  The edges of the growth are irregular. C for color:  A mixture of colors are present instead  of an even brown color. D for diameter:  Melanomas are usually (but not always) greater than 6mm - the size of a pencil eraser. E for evolution:  The spot keeps changing in size, shape, and color.  Please check your skin once per month between visits. You can use a small mirror in front and a large mirror behind you to keep an eye on the back side or your body.   If you see any new or changing lesions before your next follow-up, please call to schedule a visit.  Please continue daily skin protection  including broad spectrum sunscreen SPF 30+ to sun-exposed areas, reapplying every 2 hours as needed when you're outdoors.   Staying in the shade or wearing long sleeves, sun glasses (UVA+UVB protection) and wide brim hats (4-inch brim around the entire circumference of the hat) are also recommended for sun protection.      Gentle Skin Care Guide  1. Bathe no more than once a day.  2. Avoid bathing in hot water  3. Use a mild soap like Dove, Vanicream, Cetaphil, CeraVe. Can use Lever 2000 or Cetaphil antibacterial soap  4. Use soap only where you need it. On most days, use it under your arms, between your legs, and on your feet. Let the water rinse other areas unless visibly dirty.  5. When you get out of the bath/shower, use a towel to gently blot your skin dry, don't rub it.  6. While your skin is still a little damp, apply a moisturizing cream such as Vanicream, CeraVe, Cetaphil, Eucerin, Sarna lotion or plain Vaseline Jelly. For hands apply Neutrogena [place] Hand Cream or Excipial Hand Cream.  7. Reapply moisturizer any time you start to itch or feel dry.  8. Sometimes using free and clear laundry detergents can be helpful. Fabric softener sheets should be avoided. Downy Free & Gentle liquid, or any liquid fabric softener that is free of dyes and perfumes, it acceptable to use  9. If your doctor has given you prescription creams you may apply moisturizers over them       Due to recent changes in healthcare laws, you may see results of your pathology and/or laboratory studies on MyChart before the doctors have had a chance to review them. We understand that in some cases there may be results that are confusing or concerning to you. Please understand that not all results are received at the same time and often the doctors may need to interpret multiple results in order to provide you with the best plan of care or course of treatment. Therefore, we ask that you please give us  2  business days to thoroughly review all your results before contacting the office for clarification. Should we see a critical lab result, you will be contacted sooner.   If You Need Anything After Your Visit  If you have any questions or concerns for your doctor, please call our main line at [phone] and press option 4 to reach your doctor's medical assistant. If no one answers, please leave a voicemail as directed and we will return your call as soon as possible. Messages left after 4 pm will be answered the following business day.   You may also send us  a message via MyChart. We typically respond to MyChart messages within 1-2 business days.  For prescription refills, please ask your pharmacy to contact our office. Our fax number is [phone].  If you have an urgent issue when the clinic is closed that cannot  wait until the next business day, you can page your doctor at the number below.    Please note that while we do our best to be available for urgent issues outside of office hours, we are not available 24/7.   If you have an urgent issue and are unable to reach us , you may choose to seek medical care at your doctor's office, retail clinic, urgent care center, or emergency room.  If you have a medical emergency, please immediately call 911 or go to the emergency department.  Pager Numbers  - Dr. Hester: [phone]  - Dr. Jackquline: [phone]  - Dr. Claudene: [phone]   - Dr. Raymund: [phone]  In the event of inclement weather, please call our main line at [phone] for an update on the status of any delays or closures.  Dermatology Medication Tips: Please keep the boxes that topical medications come in in order to help keep track of the instructions about where and how to use these. Pharmacies typically print the medication instructions only on the boxes and not directly on the medication tubes.   If your medication is too expensive, please contact our office  at [phone] option 4 or send us  a message through MyChart.   We are unable to tell what your co-pay for medications will be in advance as this is different depending on your insurance coverage. However, we may be able to find a substitute medication at lower cost or fill out paperwork to get insurance to cover a needed medication.   If a prior authorization is required to get your medication covered by your insurance company, please allow us  1-2 business days to complete this process.  Drug prices often vary depending on where the prescription is filled and some pharmacies may offer cheaper prices.  The website www.goodrx.com contains coupons for medications through different pharmacies. The prices here do not account for what the cost may be with help from insurance (it may be cheaper with your insurance), but the website can give you the price if you did not use any insurance.  - You can print the associated coupon and take it with your prescription to the pharmacy.  - You may also stop by our office during regular business hours and pick up a GoodRx coupon card.  - If you need your prescription sent electronically to a different pharmacy, notify our office through [facility] or by phone at [phone] option 4.     Si Usted Necesita Algo Despus de Su Visita  Tambin puede enviarnos un mensaje a travs de Clinical cytogeneticist. Por lo general respondemos a los mensajes de MyChart en el transcurso de 1 a 2 das hbiles.  Para renovar recetas, por favor pida a su farmacia que se ponga en contacto con nuestra oficina. Randi lakes de fax es [place] [phone].  Si tiene un asunto urgente cuando la clnica est cerrada y que no puede esperar hasta el siguiente da hbil, puede llamar/localizar a su doctor(a) al nmero que aparece a continuacin.   Por favor, tenga en cuenta que aunque hacemos todo lo posible para estar disponibles para asuntos urgentes fuera del horario de [place], no estamos  disponibles las 24 horas del da, los 7 [address]  [address] de la [place].   Si tiene un problema urgente y no puede comunicarse con nosotros, puede optar por buscar atencin mdica  en el consultorio de su doctor(a), en una clnica privada, en un centro de atencin urgente o en una sala de emergencias.  Si tiene  una emergencia mdica, por favor llame inmediatamente al 911 o vaya a la sala de emergencias.  Nmeros de bper  - Dr. Hester: [phone]  - Dra. Jackquline: [phone]  - Dr. Claudene: [phone]  - Dra. Kitts: [phone]  En caso de inclemencias del [place], por favor llame a nuestra lnea principal al [phone] para una actualizacin sobre el estado de cualquier retraso o cierre.  Consejos para la medicacin en dermatologa: Por favor, guarde las cajas en las que vienen los medicamentos de uso tpico para ayudarle a seguir las instrucciones sobre dnde y cmo usarlos. Las farmacias generalmente imprimen las instrucciones del medicamento slo en las cajas y no directamente en los tubos del [place].   Si su medicamento es muy caro, por favor, pngase en contacto con landry rieger llamando al [phone] y presione la opcin 4 o envenos un mensaje a travs de Clinical cytogeneticist.   No podemos decirle cul ser su copago por los medicamentos por adelantado ya que esto es diferente dependiendo de la cobertura de su seguro. Sin embargo, es posible que podamos encontrar un medicamento sustituto a Audiological scientist un formulario para que el seguro cubra el medicamento que se considera necesario.   Si se requiere una autorizacin previa para que su compaa de seguros malta su medicamento, por favor permtanos de 1 a 2 das hbiles para completar este proceso.  Los precios de los medicamentos varan con frecuencia dependiendo del Environmental consultant de dnde se surte la receta y alguna farmacias pueden ofrecer precios ms baratos.  El sitio web www.goodrx.com tiene cupones para medicamentos de Engineer, civil (consulting). Los precios aqu no tienen en cuenta lo que podra costar con la ayuda del seguro (puede ser ms barato con su seguro), pero el sitio web puede darle el precio si no utiliz Tourist information centre manager.  - Puede imprimir el cupn correspondiente y llevarlo con su receta a la farmacia.  - Tambin puede pasar por nuestra oficina durante el horario de atencin regular y Education officer, museum una tarjeta de cupones de GoodRx.  - Si necesita que su receta se enve electrnicamente a una farmacia diferente, informe a nuestra oficina a travs de MyChart de [employer] o por telfono llamando al [phone] y presione la opcin 4.

## 2024-02-09 LAB — SURGICAL PATHOLOGY

## 2024-02-10 ENCOUNTER — Ambulatory Visit: Payer: Self-pay | Admitting: Dermatology

## 2024-02-10 ENCOUNTER — Encounter: Payer: Self-pay | Admitting: Dermatology

## 2024-02-10 NOTE — Telephone Encounter (Signed)
-----   Message from [facility] sent at 02/10/2024  8:50 AM EDT ----- Diagnosis: 1. Skin, central upper back :       SUPPURATIVE GRANULOMATOUS DERMATITIS CONSISTENT WITH A RUPTURED FOLLICULAR CYST        2. 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 Biopsy shows a benign inflamed and ruptured cyst. No treatment needed. If area is bothersome, contact us  for appointment R back: biopsy shows an atypical mole (dysplastic nevus). It is partly severely abnormal which can be difficult to distinguish from melanoma skin cancer, so we should ensure it has been fully  removed. I recommend that you return for an appointment where we remove another thin layer of skin where the mole was to check for any remaining abnormal mole. ----- Message ----- From: Interface, Lab In Three [facility] One Sent: 02/09/2024   5:27 PM EDT To: Boneta Sharps, MD

## 2024-02-10 NOTE — Telephone Encounter (Signed)
 Discussed pathology results and treatment plan. Patient voiced understanding. Appointment scheduled for shave removal.

## 2024-03-11 ENCOUNTER — Ambulatory Visit: Admitting: Dermatology

## 2024-03-23 ENCOUNTER — Encounter: Payer: Self-pay | Admitting: Dermatology

## 2024-03-23 ENCOUNTER — Ambulatory Visit (INDEPENDENT_AMBULATORY_CARE_PROVIDER_SITE_OTHER): Admitting: Dermatology

## 2024-03-23 DIAGNOSIS — D239 Other benign neoplasm of skin, unspecified: Secondary | ICD-10-CM

## 2024-03-23 DIAGNOSIS — D225 Melanocytic nevi of trunk: Secondary | ICD-10-CM

## 2024-03-23 NOTE — Patient Instructions (Signed)

## 2024-03-23 NOTE — Progress Notes (Signed)
   Follow-Up Visit   Subjective  Jack Shea is a 48 y.o. male who presents for the following: re-shave of dysplastic nevus. Bx: 02/05/2024. Dx: moderate to severe atypia.    The following portions of the chart were reviewed this encounter and updated as appropriate: medications, allergies, medical history  Review of Systems:  No other skin or systemic complaints except as noted in HPI or Assessment and Plan.  Objective  Well appearing patient in no apparent distress; mood and affect are within normal limits.  A focused examination was performed of the following areas: Back  Relevant physical exam findings are noted in the Assessment and Plan.  Right Scapular Back Pink healing biopsy site  Assessment & Plan   DYSPLASTIC NEVUS Right Scapular Back Skin / nail biopsy - Right Scapular Back Type of biopsy: tangential   Informed consent: discussed and consent obtained   Timeout: patient name, date of birth, surgical site, and procedure verified   Procedure prep:  Patient was prepped and draped in usual sterile fashion Prep type:  Isopropyl alcohol Anesthesia: the lesion was anesthetized in a standard fashion   Anesthetic:  1% lidocaine  w/ epinephrine 1-100,000 buffered w/ 8.4% NaHCO3 Instrument used: DermaBlade   Hemostasis achieved with: pressure and aluminum chloride   Outcome: patient tolerated procedure well   Post-procedure details: sterile dressing applied and wound care instructions given   Dressing type: bandage and petrolatum    Specimen 1 - Surgical pathology Differential Diagnosis: R/O residual Dysplastic nevus with mod-severe atypia.  Check Margins: Yes  Previous pathology:  [id number]   Return for TBSE As Scheduled.  I, Jill Parcell, CMA, am acting as scribe for Boneta Sharps, MD.   Documentation: I have reviewed the above documentation for accuracy and completeness, and I agree with the above.  Boneta Sharps, MD

## 2024-03-24 LAB — SURGICAL PATHOLOGY

## 2024-03-25 ENCOUNTER — Ambulatory Visit: Payer: Self-pay | Admitting: Dermatology

## 2024-05-22 ENCOUNTER — Emergency Department (HOSPITAL_BASED_OUTPATIENT_CLINIC_OR_DEPARTMENT_OTHER)
Admission: EM | Admit: 2024-05-22 | Discharge: 2024-05-22 | Disposition: A | Discharge status: Home / Self Care | Attending: Emergency Medicine | Admitting: Emergency Medicine

## 2024-05-22 ENCOUNTER — Other Ambulatory Visit: Payer: Self-pay

## 2024-05-22 ENCOUNTER — Encounter (HOSPITAL_BASED_OUTPATIENT_CLINIC_OR_DEPARTMENT_OTHER): Payer: Self-pay | Admitting: *Deleted

## 2024-05-22 DIAGNOSIS — S61213A Laceration without foreign body of left middle finger without damage to nail, initial encounter: Secondary | ICD-10-CM | POA: Insufficient documentation

## 2024-05-22 DIAGNOSIS — S6992XA Unspecified injury of left wrist, hand and finger(s), initial encounter: Secondary | ICD-10-CM | POA: Diagnosis present

## 2024-05-22 DIAGNOSIS — W260XXA Contact with knife, initial encounter: Secondary | ICD-10-CM | POA: Diagnosis not present

## 2024-05-22 MED ORDER — LIDOCAINE HCL 2 % IJ SOLN
20.0000 mL | Freq: Once | INTRAMUSCULAR | Status: AC
  Administered 2024-05-22: 400 mg via INTRADERMAL
  Filled 2024-05-22: qty 20

## 2024-05-22 NOTE — ED Triage Notes (Signed)
 Pt reports he cut his left middle finger on a knife about 15 minutes ago. Laceration to the pad of the finger, small amount of bleeding. Unsure of last tetanus.

## 2024-05-22 NOTE — Discharge Instructions (Signed)
 Please read and follow all provided instructions.  Your diagnoses today include:  1. Laceration of left middle finger without foreign body without damage to nail, initial encounter     Tests performed today include: Vital signs. See below for your results today.   Medications prescribed:  Please use over-the-counter NSAID medications (ibuprofen, naproxen) or Tylenol  (acetaminophen ) as directed on the packaging for pain -- as long as you do not have any reasons avoid these medications. Reasons to avoid NSAID medications include: weak kidneys, a history of bleeding in your stomach or gut, or uncontrolled high blood pressure or previous heart attack. Reasons to avoid Tylenol  include: liver problems or ongoing alcohol use. Never take more than 4000mg  or 8 Extra strength Tylenol  in a 24 hour period.     Take any prescribed medications only as directed.   Home care instructions:  Follow any educational materials and wound care instructions contained in this packet.   Keep affected area above the level of your heart when possible to minimize swelling. Wash area gently twice a day with warm soapy water. Do not apply alcohol or hydrogen peroxide. Cover the area if it draining or weeping.   Follow-up instructions: Suture Removal: Return to the Emergency Department or see your primary care care doctor in 7-10 days for a recheck of your wound and removal of your sutures or staples.    Return instructions:  Return to the Emergency Department if you have: Fever Worsening pain Worsening swelling of the wound Pus draining from the wound Redness of the skin that moves away from the wound, especially if it streaks away from the affected area  Any other emergent concerns  Your vital signs today were: BP (!) 148/100 (BP Location: Right Arm)   Pulse 72   Temp 98.1 F (36.7 C) (Oral)   Resp 18   Ht 6' 3 (1.905 m)   Wt 90.7 kg   SpO2 98%   BMI 25.00 kg/m  If your blood pressure (BP) was elevated  above 135/85 this visit, please have this repeated by your doctor within one month. --------------

## 2024-05-22 NOTE — ED Provider Notes (Signed)
 " [place] EMERGENCY DEPARTMENT AT [facility] Provider Note   CSN: [id number] Arrival date & time: 05/22/24  1931     Patient presents with: Laceration   Jack Shea is a 49 y.o. male.   Patient presents to the emergency department for evaluation of finger laceration.  Patient sliced his left long finger with a clean knife just prior to arrival.  Pressure applied prior to arrival.  Tetanus is up-to-date.  No distal numbness or tingling.  Patient is able to flex and extend without difficulty.       Prior to Admission medications  Medication Sig Start Date End Date Taking? Authorizing Provider  clobetasol  cream (TEMOVATE ) 0.05 % Apply twice a day as needed to affected areas until smooth. Avoid applying to face, groin, and axilla. 02/05/24   Claudene Lehmann, MD  ketoconazole  (NIZORAL ) 2 % shampoo Lather on body, leave on 3 minutes then rinse off. Use daily until improved then once a week for maintenance. 02/05/24   Claudene Lehmann, MD  omeprazole (PRILOSEC) 20 MG capsule Take 20 mg by mouth daily.    [provider]    Allergies: Patient has no known allergies.    Review of Systems  Updated Vital Signs BP (!) 148/100 (BP Location: Right Arm)   Pulse 72   Temp 98.1 F (36.7 C) (Oral)   Resp 18   Ht 6' 3 (1.905 m)   Wt 90.7 kg   SpO2 98%   BMI 25.00 kg/m   Physical Exam Vitals and nursing note reviewed.  Constitutional:      Appearance: He is well-developed.  HENT:     Head: Normocephalic and atraumatic.  Eyes:     Conjunctiva/sclera: Conjunctivae normal.  Cardiovascular:     Pulses: Normal pulses. No decreased pulses.  Musculoskeletal:        General: Tenderness present.     Left hand: Tenderness present. No bony tenderness. Normal range of motion. Normal capillary refill.     Cervical back: Normal range of motion and neck supple.     Right lower leg: No edema.     Left lower leg: No edema.     Comments: Patient with 1 cm,  superficial but mildly gaping laceration, minor bleeding, to the pad of the left long digit.  Patient is able to flex and extend.  Wound base fully explored, is clean.  No tendon involvement.  No foreign body noted.  Skin:    General: Skin is warm and dry.  Neurological:     Mental Status: He is alert.     Sensory: No sensory deficit.     Comments: Motor, sensation, and vascular distal to the injury is fully intact.   Psychiatric:        Mood and Affect: Mood normal.     (all labs ordered are listed, but only abnormal results are displayed) Labs Reviewed - No data to display  EKG: None  Radiology: No results found.   .Laceration Repair  Date/Time: 05/22/2024 8:50 PM  Performed by: Desiderio Chew, PA-C Authorized by: Desiderio Chew, PA-C   Consent:    Consent obtained:  Verbal   Consent given by:  Patient   Risks discussed:  Infection and pain Universal protocol:    Patient identity confirmed:  Verbally with patient and provided demographic data Anesthesia:    Anesthesia method:  Local infiltration   Local anesthetic:  Lidocaine  2% w/o epi Laceration details:    Location:  Finger   Finger location:  L  long finger   Length (cm):  1 Pre-procedure details:    Preparation:  Patient was prepped and draped in usual sterile fashion Exploration:    Hemostasis achieved with:  Direct pressure Treatment:    Area cleansed with:  Shur-Clens   Amount of cleaning:  Standard Skin repair:    Repair method:  Sutures   Suture size:  5-0   Suture material:  Nylon   Suture technique:  Simple interrupted   Number of sutures:  4 Approximation:    Approximation:  Close Repair type:    Repair type:  Simple Post-procedure details:    Dressing:  Open (no dressing)   Procedure completion:  Tolerated well, no immediate complications    Medications Ordered in the ED  lidocaine  (XYLOCAINE ) 2 % (with pres) injection 400 mg (has no administration in time range)   ED Course  Patient  seen and examined. History obtained directly from patient.   Labs/EKG: None ordered  Imaging: None ordered  Medications/Fluids: Ordered: Lidocaine  2% without epinephrine.   Most recent vital signs reviewed and are as follows: BP (!) 148/100 (BP Location: Right Arm)   Pulse 72   Temp 98.1 F (36.7 C) (Oral)   Resp 18   Ht 6' 3 (1.905 m)   Wt 90.7 kg   SpO2 98%   BMI 25.00 kg/m   Initial impression: Finger laceration.  The wound is superficial but mildly gaping.  We discussed closure with sutures which would be preferred, also closing with Dermabond.  Patient agrees to proceed with suture repair.  8:52 PM Reassessment performed. Patient appears stable, comfortable.  Tolerated wound repair well.  Reviewed pertinent lab work and imaging with patient at bedside. Questions answered.   Most current vital signs reviewed and are as follows: BP (!) 148/100 (BP Location: Right Arm)   Pulse 72   Temp 98.1 F (36.7 C) (Oral)   Resp 18   Ht 6' 3 (1.905 m)   Wt 90.7 kg   SpO2 98%   BMI 25.00 kg/m   Plan: Discharge to home.   Prescriptions written for: None  8:53 PM Patient counseled on wound care. Patient counseled on need to return or see PCP/urgent care for suture removal in 7-10 days. Patient was urged to return to the Emergency Department urgently with worsening pain, swelling, expanding erythema especially if it streaks away from the affected area, fever, or if they have any other concerns. Patient verbalized understanding.                                    Medical Decision Making Risk Prescription drug management.   Patient was superficial laceration to the left long finger from a clean knife.  Tetanus is up-to-date.  No tendon involvement suspected.  Wound cleaned, repaired without complication.  Patient counseled on follow-up.  No indication for emergent orthopedic in consultation.     Final diagnoses:  Laceration of left middle finger without foreign body  without damage to nail, initial encounter    ED Discharge Orders     None          Desiderio Chew, [place] 05/22/24 2053  "

## 2025-02-10 ENCOUNTER — Ambulatory Visit: Admitting: Dermatology
